# Patient Record
Sex: Male | Born: 1935
Health system: Southern US, Community
[De-identification: ages and names within clinical notes are randomized; demographics above are authoritative.]

## PROBLEM LIST (undated history)

## (undated) DIAGNOSIS — I639 Cerebral infarction, unspecified: Secondary | ICD-10-CM

## (undated) DIAGNOSIS — H269 Unspecified cataract: Secondary | ICD-10-CM

## (undated) DIAGNOSIS — Z801 Family history of malignant neoplasm of trachea, bronchus and lung: Secondary | ICD-10-CM

## (undated) DIAGNOSIS — J449 Chronic obstructive pulmonary disease, unspecified: Secondary | ICD-10-CM

## (undated) DIAGNOSIS — J439 Emphysema, unspecified: Secondary | ICD-10-CM

## (undated) DIAGNOSIS — I6529 Occlusion and stenosis of unspecified carotid artery: Secondary | ICD-10-CM

## (undated) DIAGNOSIS — C4490 Unspecified malignant neoplasm of skin, unspecified: Secondary | ICD-10-CM

## (undated) DIAGNOSIS — M479 Spondylosis, unspecified: Secondary | ICD-10-CM

## (undated) DIAGNOSIS — E785 Hyperlipidemia, unspecified: Secondary | ICD-10-CM

## (undated) DIAGNOSIS — Z8551 Personal history of malignant neoplasm of bladder: Secondary | ICD-10-CM

## (undated) DIAGNOSIS — Z720 Tobacco use: Secondary | ICD-10-CM

## (undated) DIAGNOSIS — I10 Essential (primary) hypertension: Secondary | ICD-10-CM

## (undated) DIAGNOSIS — IMO0002 Reserved for concepts with insufficient information to code with codable children: Secondary | ICD-10-CM

## (undated) DIAGNOSIS — I700 Atherosclerosis of aorta: Secondary | ICD-10-CM

## (undated) DIAGNOSIS — K4090 Unilateral inguinal hernia, without obstruction or gangrene, not specified as recurrent: Secondary | ICD-10-CM

## (undated) DIAGNOSIS — C3430 Malignant neoplasm of lower lobe, unspecified bronchus or lung: Secondary | ICD-10-CM

## (undated) HISTORY — DX: Essential (primary) hypertension: I10

## (undated) HISTORY — DX: Unspecified cataract: H26.9

## (undated) HISTORY — DX: Unilateral inguinal hernia, without obstruction or gangrene, not specified as recurrent: K40.90

## (undated) HISTORY — DX: Tobacco use: Z72.0

## (undated) HISTORY — DX: Unspecified malignant neoplasm of skin, unspecified: C44.90

## (undated) HISTORY — DX: Spondylosis, unspecified: M47.9

## (undated) HISTORY — DX: Family history of malignant neoplasm of trachea, bronchus and lung: Z80.1

## (undated) HISTORY — DX: Emphysema, unspecified: J43.9

## (undated) HISTORY — PX: CATARACT EXTRACTION: SUR2

## (undated) HISTORY — DX: Hyperlipidemia, unspecified: E78.5

## (undated) HISTORY — DX: Cerebral infarction, unspecified: I63.9

## (undated) HISTORY — PX: FLEXIBLE BRONCHOSCOPY W/ LASER: SHX1646

## (undated) HISTORY — DX: Occlusion and stenosis of unspecified carotid artery: I65.29

## (undated) HISTORY — DX: Personal history of malignant neoplasm of bladder: Z85.51

## (undated) HISTORY — PX: EYE SURGERY: SHX253

## (undated) HISTORY — DX: Chronic obstructive pulmonary disease, unspecified: J44.9

## (undated) HISTORY — DX: Reserved for concepts with insufficient information to code with codable children: IMO0002

## (undated) HISTORY — DX: Malignant neoplasm of lower lobe, unspecified bronchus or lung: C34.30

---

## 1997-08-27 ENCOUNTER — Ambulatory Visit (HOSPITAL_BASED_OUTPATIENT_CLINIC_OR_DEPARTMENT_OTHER): Admission: RE | Admit: 1997-08-27 | Discharge: 1997-08-27 | Payer: Self-pay | Admitting: Urology

## 1997-11-14 ENCOUNTER — Ambulatory Visit (HOSPITAL_COMMUNITY): Admission: RE | Admit: 1997-11-14 | Discharge: 1997-11-14 | Payer: Self-pay | Admitting: Pulmonary Disease

## 1997-11-20 ENCOUNTER — Ambulatory Visit: Admission: RE | Admit: 1997-11-20 | Discharge: 1997-11-20 | Payer: Self-pay | Admitting: Pulmonary Disease

## 1997-11-26 ENCOUNTER — Ambulatory Visit: Admission: RE | Admit: 1997-11-26 | Discharge: 1997-11-26 | Payer: Self-pay | Admitting: Pulmonary Disease

## 1997-12-09 ENCOUNTER — Encounter: Payer: Self-pay | Admitting: Thoracic Surgery

## 1997-12-10 ENCOUNTER — Observation Stay: Admission: RE | Admit: 1997-12-10 | Discharge: 1997-12-11 | Payer: Self-pay | Admitting: Thoracic Surgery

## 1997-12-10 ENCOUNTER — Encounter: Payer: Self-pay | Admitting: Thoracic Surgery

## 1997-12-11 ENCOUNTER — Encounter: Payer: Self-pay | Admitting: Thoracic Surgery

## 1997-12-31 ENCOUNTER — Inpatient Hospital Stay: Admission: RE | Admit: 1997-12-31 | Discharge: 1998-01-06 | Payer: Self-pay | Admitting: Thoracic Surgery

## 1997-12-31 ENCOUNTER — Encounter: Payer: Self-pay | Admitting: Thoracic Surgery

## 1997-12-31 HISTORY — PX: OTHER SURGICAL HISTORY: SHX169

## 1998-01-01 ENCOUNTER — Encounter: Payer: Self-pay | Admitting: Thoracic Surgery

## 1998-01-02 ENCOUNTER — Encounter: Payer: Self-pay | Admitting: Thoracic Surgery

## 1998-01-03 ENCOUNTER — Encounter: Payer: Self-pay | Admitting: Thoracic Surgery

## 1998-01-04 ENCOUNTER — Encounter: Payer: Self-pay | Admitting: Thoracic Surgery

## 1998-01-06 ENCOUNTER — Encounter: Payer: Self-pay | Admitting: Thoracic Surgery

## 1998-05-09 ENCOUNTER — Ambulatory Visit (HOSPITAL_COMMUNITY): Admission: RE | Admit: 1998-05-09 | Discharge: 1998-05-09 | Payer: Self-pay | Admitting: Thoracic Surgery

## 1998-05-09 ENCOUNTER — Encounter: Payer: Self-pay | Admitting: Thoracic Surgery

## 1998-05-09 HISTORY — PX: OTHER SURGICAL HISTORY: SHX169

## 1998-05-19 ENCOUNTER — Ambulatory Visit (HOSPITAL_COMMUNITY): Admission: RE | Admit: 1998-05-19 | Discharge: 1998-05-19 | Payer: Self-pay | Admitting: Thoracic Surgery

## 1998-05-19 ENCOUNTER — Encounter: Payer: Self-pay | Admitting: Thoracic Surgery

## 1998-05-22 ENCOUNTER — Encounter: Payer: Self-pay | Admitting: Thoracic Surgery

## 1998-05-22 ENCOUNTER — Ambulatory Visit (HOSPITAL_COMMUNITY): Admission: RE | Admit: 1998-05-22 | Discharge: 1998-05-22 | Payer: Self-pay | Admitting: Thoracic Surgery

## 1998-07-11 ENCOUNTER — Ambulatory Visit (HOSPITAL_BASED_OUTPATIENT_CLINIC_OR_DEPARTMENT_OTHER): Admission: RE | Admit: 1998-07-11 | Discharge: 1998-07-11 | Payer: Self-pay | Admitting: Otolaryngology

## 1998-07-11 HISTORY — PX: TYMPANOPLASTY: SHX33

## 1998-11-02 HISTORY — PX: VIDEO BRONCHOSCOPY: SHX5072

## 1998-11-03 ENCOUNTER — Encounter: Payer: Self-pay | Admitting: Thoracic Surgery

## 1998-11-03 ENCOUNTER — Ambulatory Visit (HOSPITAL_COMMUNITY): Admission: RE | Admit: 1998-11-03 | Discharge: 1998-11-03 | Payer: Self-pay | Admitting: Thoracic Surgery

## 1999-02-18 ENCOUNTER — Encounter: Payer: Self-pay | Admitting: Thoracic Surgery

## 1999-02-18 ENCOUNTER — Encounter: Admission: RE | Admit: 1999-02-18 | Discharge: 1999-02-18 | Payer: Self-pay | Admitting: Thoracic Surgery

## 1999-05-26 ENCOUNTER — Encounter: Admission: RE | Admit: 1999-05-26 | Discharge: 1999-05-26 | Payer: Self-pay | Admitting: Thoracic Surgery

## 1999-05-26 ENCOUNTER — Encounter: Payer: Self-pay | Admitting: Thoracic Surgery

## 1999-06-01 ENCOUNTER — Encounter: Payer: Self-pay | Admitting: Thoracic Surgery

## 1999-06-02 ENCOUNTER — Ambulatory Visit (HOSPITAL_COMMUNITY): Admission: RE | Admit: 1999-06-02 | Discharge: 1999-06-02 | Payer: Self-pay | Admitting: Thoracic Surgery

## 1999-06-02 ENCOUNTER — Encounter (INDEPENDENT_AMBULATORY_CARE_PROVIDER_SITE_OTHER): Payer: Self-pay | Admitting: *Deleted

## 1999-06-02 HISTORY — PX: OTHER SURGICAL HISTORY: SHX169

## 1999-07-06 ENCOUNTER — Ambulatory Visit (HOSPITAL_COMMUNITY): Admission: RE | Admit: 1999-07-06 | Discharge: 1999-07-06 | Payer: Self-pay | Admitting: Thoracic Surgery

## 1999-07-06 ENCOUNTER — Encounter: Payer: Self-pay | Admitting: Thoracic Surgery

## 1999-07-06 ENCOUNTER — Encounter (INDEPENDENT_AMBULATORY_CARE_PROVIDER_SITE_OTHER): Payer: Self-pay | Admitting: *Deleted

## 1999-07-06 HISTORY — PX: MYRINGOTOMY: SUR874

## 1999-08-24 ENCOUNTER — Encounter: Admission: RE | Admit: 1999-08-24 | Discharge: 1999-08-24 | Payer: Self-pay | Admitting: Oncology

## 1999-08-24 ENCOUNTER — Encounter: Payer: Self-pay | Admitting: Oncology

## 1999-09-17 ENCOUNTER — Encounter: Payer: Self-pay | Admitting: Thoracic Surgery

## 1999-09-21 ENCOUNTER — Encounter (INDEPENDENT_AMBULATORY_CARE_PROVIDER_SITE_OTHER): Payer: Self-pay | Admitting: Specialist

## 1999-09-21 ENCOUNTER — Ambulatory Visit (HOSPITAL_COMMUNITY): Admission: RE | Admit: 1999-09-21 | Discharge: 1999-09-21 | Payer: Self-pay | Admitting: Thoracic Surgery

## 1999-10-14 ENCOUNTER — Ambulatory Visit (HOSPITAL_COMMUNITY): Admission: RE | Admit: 1999-10-14 | Discharge: 1999-10-14 | Payer: Self-pay | Admitting: Internal Medicine

## 1999-10-23 ENCOUNTER — Encounter: Payer: Self-pay | Admitting: Thoracic Surgery

## 1999-10-23 ENCOUNTER — Encounter: Admission: RE | Admit: 1999-10-23 | Discharge: 1999-10-23 | Payer: Self-pay | Admitting: Thoracic Surgery

## 1999-10-29 ENCOUNTER — Ambulatory Visit (HOSPITAL_COMMUNITY): Admission: RE | Admit: 1999-10-29 | Discharge: 1999-10-29 | Payer: Self-pay | Admitting: Internal Medicine

## 1999-10-29 ENCOUNTER — Encounter: Payer: Self-pay | Admitting: Internal Medicine

## 1999-12-22 ENCOUNTER — Encounter (INDEPENDENT_AMBULATORY_CARE_PROVIDER_SITE_OTHER): Payer: Self-pay

## 1999-12-22 ENCOUNTER — Ambulatory Visit (HOSPITAL_COMMUNITY): Admission: RE | Admit: 1999-12-22 | Discharge: 1999-12-22 | Payer: Self-pay | Admitting: Urology

## 1999-12-22 HISTORY — PX: OTHER SURGICAL HISTORY: SHX169

## 2000-02-23 ENCOUNTER — Encounter: Payer: Self-pay | Admitting: Thoracic Surgery

## 2000-02-23 ENCOUNTER — Encounter: Admission: RE | Admit: 2000-02-23 | Discharge: 2000-02-23 | Payer: Self-pay | Admitting: Thoracic Surgery

## 2000-04-05 ENCOUNTER — Encounter: Payer: Self-pay | Admitting: Thoracic Surgery

## 2000-04-07 ENCOUNTER — Ambulatory Visit (HOSPITAL_COMMUNITY): Admission: RE | Admit: 2000-04-07 | Discharge: 2000-04-07 | Payer: Self-pay | Admitting: Thoracic Surgery

## 2000-04-07 ENCOUNTER — Encounter (INDEPENDENT_AMBULATORY_CARE_PROVIDER_SITE_OTHER): Payer: Self-pay | Admitting: *Deleted

## 2000-07-20 ENCOUNTER — Encounter: Payer: Self-pay | Admitting: Thoracic Surgery

## 2000-07-20 ENCOUNTER — Encounter: Admission: RE | Admit: 2000-07-20 | Discharge: 2000-07-20 | Payer: Self-pay | Admitting: Thoracic Surgery

## 2000-10-19 ENCOUNTER — Encounter: Admission: RE | Admit: 2000-10-19 | Discharge: 2000-10-19 | Payer: Self-pay | Admitting: Thoracic Surgery

## 2000-10-19 ENCOUNTER — Encounter: Payer: Self-pay | Admitting: Thoracic Surgery

## 2000-11-09 ENCOUNTER — Encounter (INDEPENDENT_AMBULATORY_CARE_PROVIDER_SITE_OTHER): Payer: Self-pay | Admitting: *Deleted

## 2000-11-09 ENCOUNTER — Ambulatory Visit (HOSPITAL_COMMUNITY): Admission: RE | Admit: 2000-11-09 | Discharge: 2000-11-09 | Payer: Self-pay | Admitting: Thoracic Surgery

## 2000-11-09 ENCOUNTER — Encounter: Payer: Self-pay | Admitting: Thoracic Surgery

## 2000-12-28 ENCOUNTER — Encounter: Payer: Self-pay | Admitting: Otolaryngology

## 2000-12-28 ENCOUNTER — Ambulatory Visit (HOSPITAL_COMMUNITY): Admission: RE | Admit: 2000-12-28 | Discharge: 2000-12-28 | Payer: Self-pay | Admitting: Otolaryngology

## 2001-01-04 ENCOUNTER — Encounter: Payer: Self-pay | Admitting: Oncology

## 2001-01-04 ENCOUNTER — Ambulatory Visit: Admission: RE | Admit: 2001-01-04 | Discharge: 2001-01-04 | Payer: Self-pay | Admitting: Oncology

## 2001-05-17 ENCOUNTER — Encounter: Payer: Self-pay | Admitting: Thoracic Surgery

## 2001-05-17 ENCOUNTER — Encounter: Admission: RE | Admit: 2001-05-17 | Discharge: 2001-05-17 | Payer: Self-pay | Admitting: Thoracic Surgery

## 2001-06-28 ENCOUNTER — Ambulatory Visit (HOSPITAL_COMMUNITY): Admission: RE | Admit: 2001-06-28 | Discharge: 2001-06-28 | Payer: Self-pay | Admitting: Oncology

## 2001-06-28 ENCOUNTER — Encounter: Payer: Self-pay | Admitting: Oncology

## 2001-08-23 ENCOUNTER — Encounter (INDEPENDENT_AMBULATORY_CARE_PROVIDER_SITE_OTHER): Payer: Self-pay | Admitting: *Deleted

## 2001-08-23 ENCOUNTER — Inpatient Hospital Stay (HOSPITAL_COMMUNITY): Admission: AD | Admit: 2001-08-23 | Discharge: 2001-08-25 | Payer: Self-pay | Admitting: Otolaryngology

## 2001-08-23 ENCOUNTER — Encounter: Payer: Self-pay | Admitting: Otolaryngology

## 2001-08-23 HISTORY — PX: OTHER SURGICAL HISTORY: SHX169

## 2001-08-24 ENCOUNTER — Encounter: Payer: Self-pay | Admitting: Otolaryngology

## 2001-11-14 ENCOUNTER — Encounter: Admission: RE | Admit: 2001-11-14 | Discharge: 2001-11-14 | Payer: Self-pay | Admitting: Thoracic Surgery

## 2001-11-14 ENCOUNTER — Encounter: Payer: Self-pay | Admitting: Thoracic Surgery

## 2001-12-26 ENCOUNTER — Ambulatory Visit (HOSPITAL_COMMUNITY): Admission: RE | Admit: 2001-12-26 | Discharge: 2001-12-26 | Payer: Self-pay | Admitting: Oncology

## 2001-12-26 ENCOUNTER — Encounter: Payer: Self-pay | Admitting: Oncology

## 2002-05-17 ENCOUNTER — Encounter: Admission: RE | Admit: 2002-05-17 | Discharge: 2002-05-17 | Payer: Self-pay | Admitting: Thoracic Surgery

## 2002-05-17 ENCOUNTER — Encounter: Payer: Self-pay | Admitting: Thoracic Surgery

## 2002-11-13 ENCOUNTER — Encounter: Payer: Self-pay | Admitting: Oncology

## 2002-11-13 ENCOUNTER — Encounter: Admission: RE | Admit: 2002-11-13 | Discharge: 2002-11-13 | Payer: Self-pay | Admitting: Oncology

## 2003-05-14 ENCOUNTER — Encounter: Admission: RE | Admit: 2003-05-14 | Discharge: 2003-05-14 | Payer: Self-pay | Admitting: Thoracic Surgery

## 2003-11-11 ENCOUNTER — Encounter: Admission: RE | Admit: 2003-11-11 | Discharge: 2003-11-11 | Payer: Self-pay | Admitting: Oncology

## 2004-02-13 ENCOUNTER — Ambulatory Visit: Payer: Self-pay | Admitting: Oncology

## 2004-02-14 ENCOUNTER — Encounter: Admission: RE | Admit: 2004-02-14 | Discharge: 2004-02-14 | Payer: Self-pay | Admitting: Oncology

## 2004-05-12 ENCOUNTER — Encounter
Admission: RE | Admit: 2004-05-12 | Discharge: 2004-05-12 | Payer: Self-pay | Admitting: Thoracic Surgery (Cardiothoracic Vascular Surgery)

## 2004-11-10 ENCOUNTER — Encounter: Admission: RE | Admit: 2004-11-10 | Discharge: 2004-11-10 | Payer: Self-pay | Admitting: Thoracic Surgery

## 2005-02-17 ENCOUNTER — Ambulatory Visit: Payer: Self-pay | Admitting: Oncology

## 2005-02-19 ENCOUNTER — Encounter: Admission: RE | Admit: 2005-02-19 | Discharge: 2005-02-19 | Payer: Self-pay | Admitting: Oncology

## 2005-05-12 ENCOUNTER — Encounter: Admission: RE | Admit: 2005-05-12 | Discharge: 2005-05-12 | Payer: Self-pay | Admitting: Thoracic Surgery

## 2005-11-24 ENCOUNTER — Encounter: Admission: RE | Admit: 2005-11-24 | Discharge: 2005-11-24 | Payer: Self-pay | Admitting: Thoracic Surgery

## 2006-02-14 ENCOUNTER — Ambulatory Visit: Payer: Self-pay | Admitting: Oncology

## 2006-02-16 LAB — COMPREHENSIVE METABOLIC PANEL
ALT: 9 U/L (ref 0–53)
Alkaline Phosphatase: 121 U/L — ABNORMAL HIGH (ref 39–117)
CO2: 29 mEq/L (ref 19–32)
Creatinine, Ser: 0.98 mg/dL (ref 0.40–1.50)
Sodium: 142 mEq/L (ref 135–145)
Total Bilirubin: 0.9 mg/dL (ref 0.3–1.2)
Total Protein: 7.1 g/dL (ref 6.0–8.3)

## 2006-02-16 LAB — CBC WITH DIFFERENTIAL/PLATELET
BASO%: 0.3 % (ref 0.0–2.0)
EOS%: 0.7 % (ref 0.0–7.0)
HCT: 50.5 % — ABNORMAL HIGH (ref 38.7–49.9)
LYMPH%: 27.5 % (ref 14.0–48.0)
MCH: 31.6 pg (ref 28.0–33.4)
MCHC: 33.6 g/dL (ref 32.0–35.9)
MCV: 94 fL (ref 81.6–98.0)
MONO#: 0.7 10*3/uL (ref 0.1–0.9)
MONO%: 8.1 % (ref 0.0–13.0)
NEUT%: 63.4 % (ref 40.0–75.0)
Platelets: 185 10*3/uL (ref 145–400)
RBC: 5.38 10*6/uL (ref 4.20–5.71)
WBC: 8.9 10*3/uL (ref 4.0–10.0)

## 2006-02-16 LAB — LACTATE DEHYDROGENASE: LDH: 130 U/L (ref 94–250)

## 2006-02-24 ENCOUNTER — Ambulatory Visit (HOSPITAL_COMMUNITY): Admission: RE | Admit: 2006-02-24 | Discharge: 2006-02-24 | Payer: Self-pay | Admitting: Oncology

## 2006-06-29 ENCOUNTER — Ambulatory Visit: Payer: Self-pay | Admitting: Thoracic Surgery

## 2006-06-29 ENCOUNTER — Encounter: Admission: RE | Admit: 2006-06-29 | Discharge: 2006-06-29 | Payer: Self-pay | Admitting: Thoracic Surgery

## 2006-11-14 ENCOUNTER — Ambulatory Visit: Payer: Self-pay | Admitting: Surgery

## 2006-11-18 ENCOUNTER — Ambulatory Visit: Payer: Self-pay | Admitting: Vascular Surgery

## 2006-11-28 ENCOUNTER — Ambulatory Visit: Payer: Self-pay | Admitting: Vascular Surgery

## 2006-11-28 ENCOUNTER — Encounter: Payer: Self-pay | Admitting: Vascular Surgery

## 2006-11-28 ENCOUNTER — Inpatient Hospital Stay (HOSPITAL_COMMUNITY): Admission: RE | Admit: 2006-11-28 | Discharge: 2006-11-29 | Payer: Self-pay | Admitting: Vascular Surgery

## 2006-12-01 ENCOUNTER — Ambulatory Visit: Payer: Self-pay | Admitting: *Deleted

## 2006-12-16 ENCOUNTER — Ambulatory Visit: Payer: Self-pay | Admitting: Surgery

## 2006-12-28 ENCOUNTER — Encounter: Admission: RE | Admit: 2006-12-28 | Discharge: 2006-12-28 | Payer: Self-pay | Admitting: Thoracic Surgery

## 2006-12-28 ENCOUNTER — Ambulatory Visit: Payer: Self-pay | Admitting: Thoracic Surgery

## 2007-02-13 ENCOUNTER — Ambulatory Visit: Payer: Self-pay | Admitting: Oncology

## 2007-02-15 ENCOUNTER — Ambulatory Visit (HOSPITAL_COMMUNITY): Admission: RE | Admit: 2007-02-15 | Discharge: 2007-02-15 | Payer: Self-pay | Admitting: Oncology

## 2007-02-15 LAB — LACTATE DEHYDROGENASE: LDH: 96 U/L (ref 94–250)

## 2007-02-15 LAB — COMPREHENSIVE METABOLIC PANEL
BUN: 11 mg/dL (ref 6–23)
CO2: 31 mEq/L (ref 19–32)
Calcium: 9.6 mg/dL (ref 8.4–10.5)
Chloride: 105 mEq/L (ref 96–112)
Creatinine, Ser: 0.96 mg/dL (ref 0.40–1.50)
Total Bilirubin: 1.1 mg/dL (ref 0.3–1.2)

## 2007-02-15 LAB — CBC WITH DIFFERENTIAL/PLATELET
Basophils Absolute: 0 10*3/uL (ref 0.0–0.1)
EOS%: 1.7 % (ref 0.0–7.0)
HCT: 47.7 % (ref 38.7–49.9)
HGB: 16.4 g/dL (ref 13.0–17.1)
LYMPH%: 25.6 % (ref 14.0–48.0)
MCH: 32.5 pg (ref 28.0–33.4)
MCHC: 34.3 g/dL (ref 32.0–35.9)
MONO#: 0.7 10*3/uL (ref 0.1–0.9)
NEUT%: 63.2 % (ref 40.0–75.0)
Platelets: 170 10*3/uL (ref 145–400)
lymph#: 2 10*3/uL (ref 0.9–3.3)

## 2007-06-14 ENCOUNTER — Ambulatory Visit: Payer: Self-pay | Admitting: Thoracic Surgery

## 2007-06-14 ENCOUNTER — Encounter: Admission: RE | Admit: 2007-06-14 | Discharge: 2007-06-14 | Payer: Self-pay | Admitting: Thoracic Surgery

## 2007-06-16 ENCOUNTER — Ambulatory Visit: Payer: Self-pay | Admitting: Vascular Surgery

## 2007-12-19 ENCOUNTER — Ambulatory Visit: Payer: Self-pay | Admitting: Thoracic Surgery

## 2007-12-19 ENCOUNTER — Encounter: Admission: RE | Admit: 2007-12-19 | Discharge: 2007-12-19 | Payer: Self-pay | Admitting: Thoracic Surgery

## 2007-12-22 ENCOUNTER — Ambulatory Visit: Payer: Self-pay | Admitting: Vascular Surgery

## 2008-02-14 ENCOUNTER — Ambulatory Visit: Payer: Self-pay | Admitting: Oncology

## 2008-02-16 ENCOUNTER — Ambulatory Visit (HOSPITAL_COMMUNITY): Admission: RE | Admit: 2008-02-16 | Discharge: 2008-02-16 | Payer: Self-pay | Admitting: Oncology

## 2008-02-16 LAB — LACTATE DEHYDROGENASE: LDH: 93 U/L — ABNORMAL LOW (ref 94–250)

## 2008-02-16 LAB — COMPREHENSIVE METABOLIC PANEL
ALT: 11 U/L (ref 0–53)
AST: 13 U/L (ref 0–37)
CO2: 31 mEq/L (ref 19–32)
Creatinine, Ser: 0.93 mg/dL (ref 0.40–1.50)
Sodium: 143 mEq/L (ref 135–145)
Total Bilirubin: 1.1 mg/dL (ref 0.3–1.2)
Total Protein: 6.5 g/dL (ref 6.0–8.3)

## 2008-02-16 LAB — CBC WITH DIFFERENTIAL/PLATELET
BASO%: 0.3 % (ref 0.0–2.0)
Eosinophils Absolute: 0.1 10*3/uL (ref 0.0–0.5)
MCHC: 34 g/dL (ref 32.0–35.9)
MCV: 95.4 fL (ref 81.6–98.0)
MONO#: 0.6 10*3/uL (ref 0.1–0.9)
MONO%: 8.1 % (ref 0.0–13.0)
NEUT#: 4.3 10*3/uL (ref 1.5–6.5)
RBC: 4.97 10*6/uL (ref 4.20–5.71)
RDW: 13.9 % (ref 11.2–14.6)
WBC: 7.1 10*3/uL (ref 4.0–10.0)

## 2008-05-26 IMAGING — CT CT CHEST W/ CM
2 of 3 series · 15 of 36 positions shown, 18 images · IV contrast (omnipaque)
Comparison: 12/28/06.

CLINICAL DATA: Follow-up lung cancer. 
CHEST CT WITH CONTRAST:
TECHNIQUE: Multidetector CT imaging of the chest was performed following the standard protocol during bolus administration of intravenous contrast.
Contrast:  80 cc Omnipaque 300.

[Series 2: chest routine 5.0 b40f · axial · 0.77mm/px · z∈[-432,-137]mm · 12 of 71 slices shown, 15 images]
[im 6/71  mediastinal]
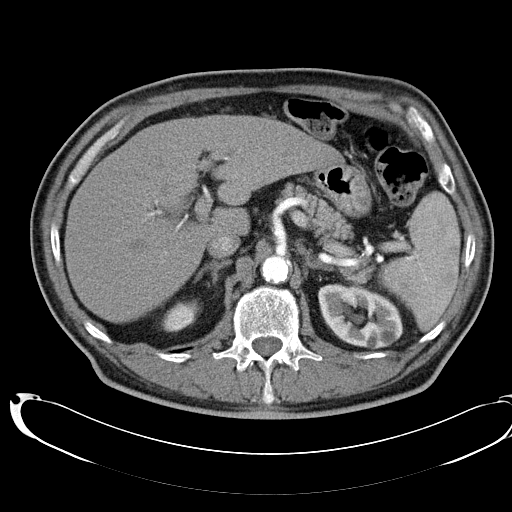
[im 6/71  lung]
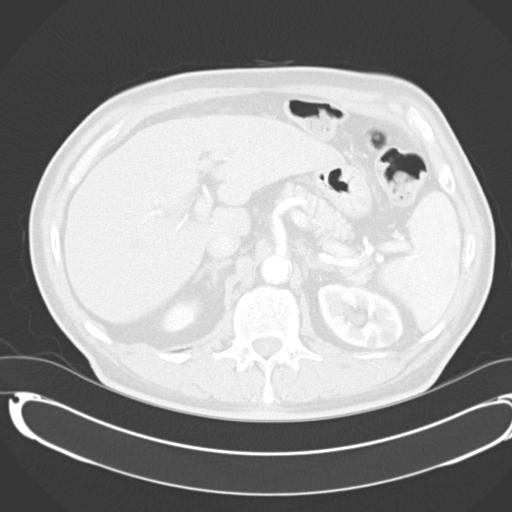
[im 11/71  lung]
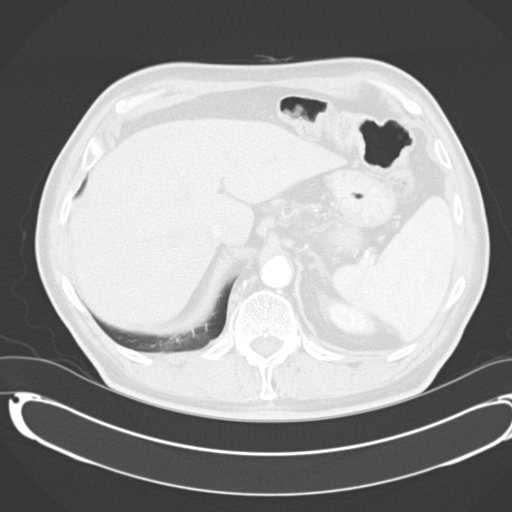
[im 16/71  lung]
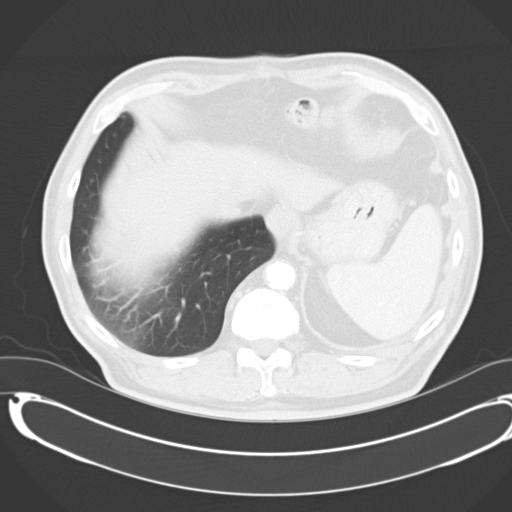
[im 21/71  lung]
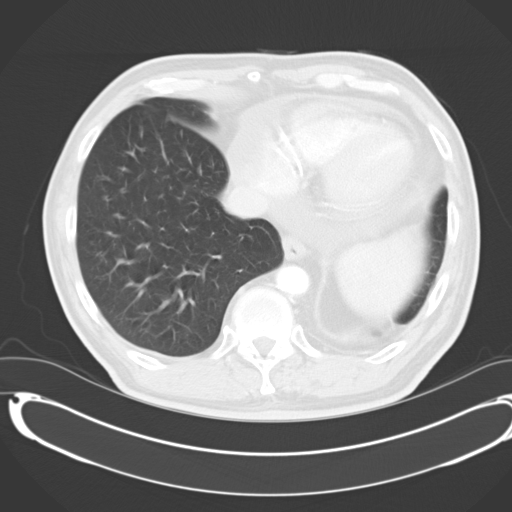
[im 26/71  mediastinal]
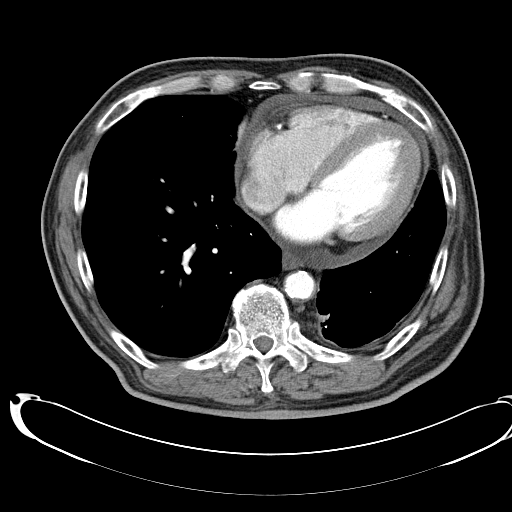
[im 26/71  lung]
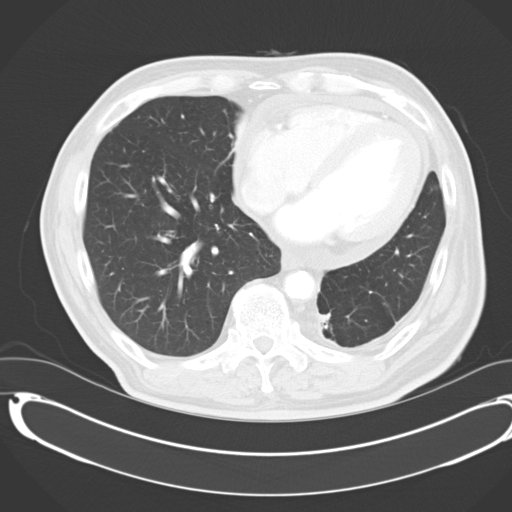
[im 32/71  lung]
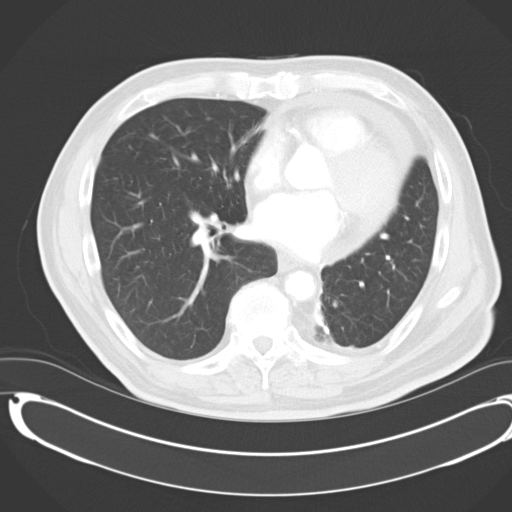
[im 39/71  lung]
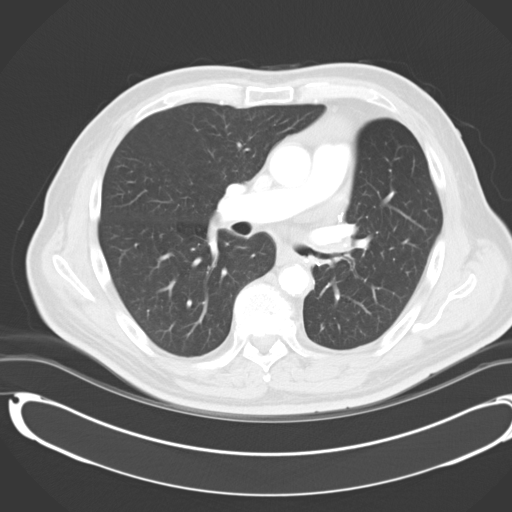
[im 45/71  lung]
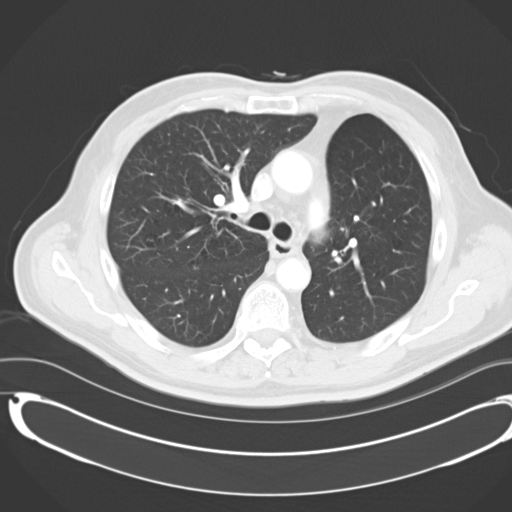
[im 50/71  mediastinal]
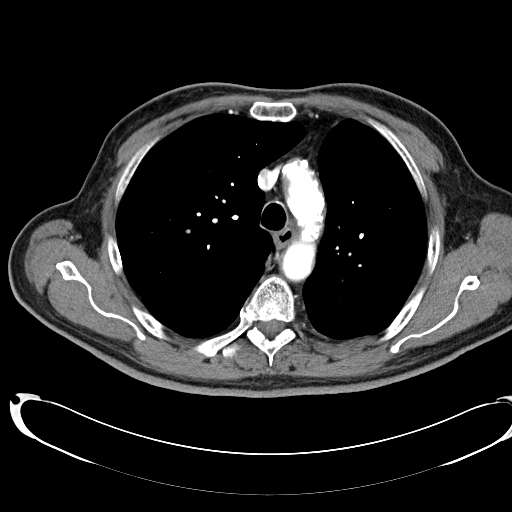
[im 50/71  lung]
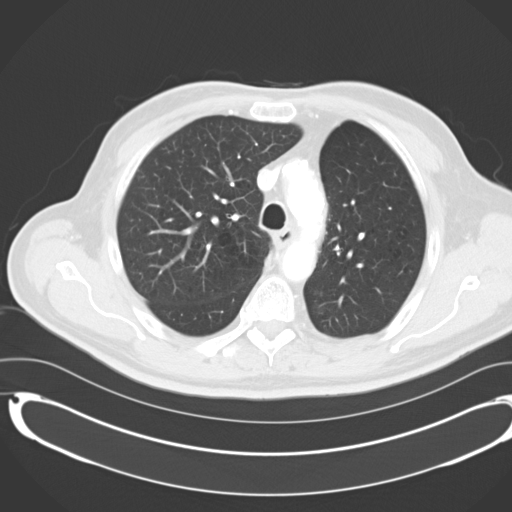
[im 55/71  lung]
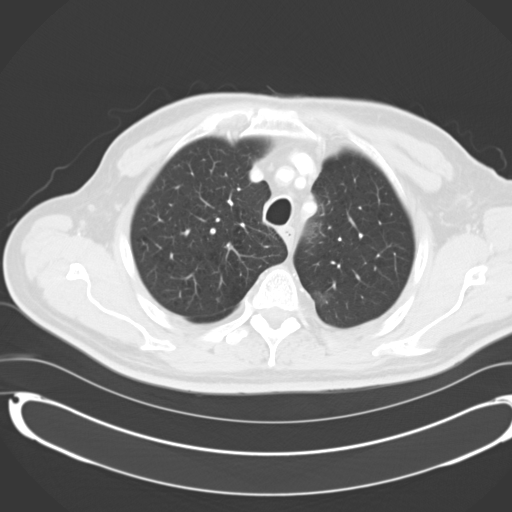
[im 60/71  lung]
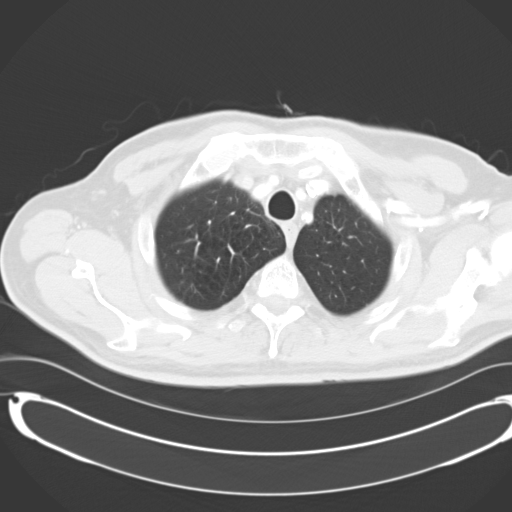
[im 65/71  lung]
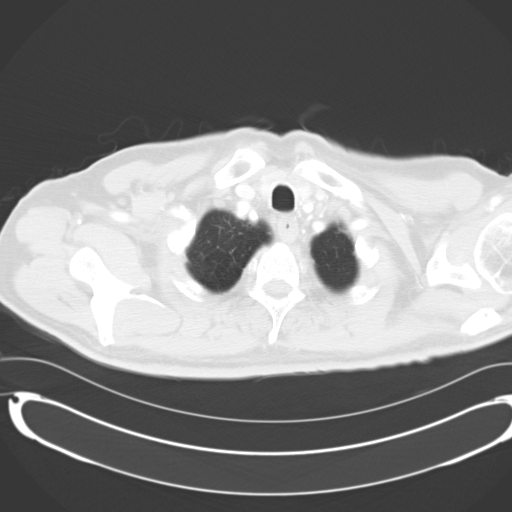

[Series 602: <mpr thick range> · coronal · 0.77mm/px · 3 of 74 slices shown]
[im 15/74  lung]
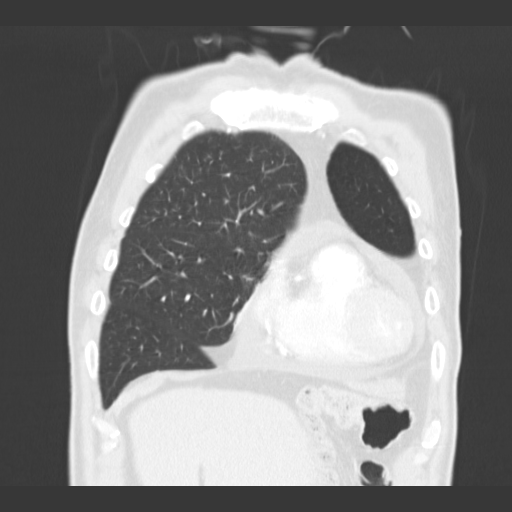
[im 30/74  lung]
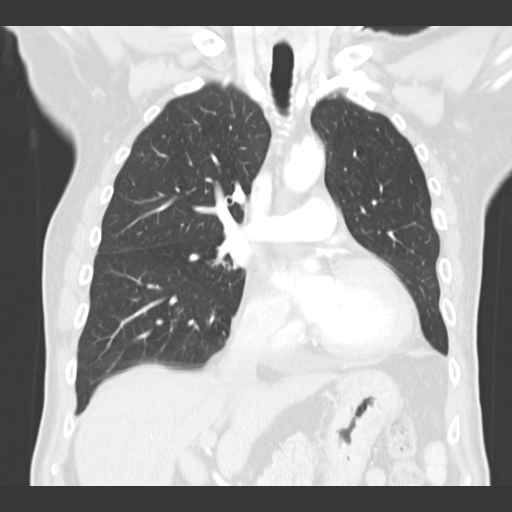
[im 44/74  lung]
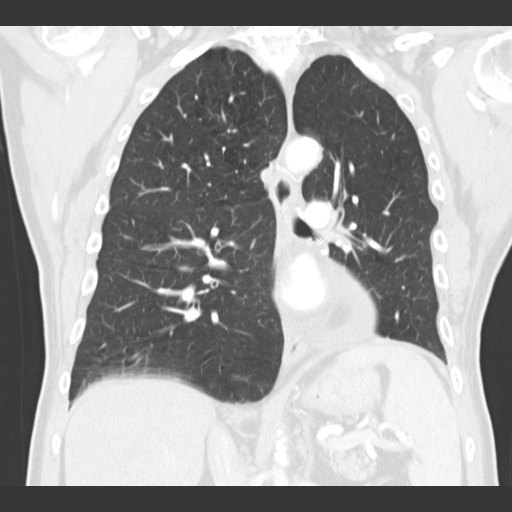

[15 of 36 positions shown; findings below may reference images not displayed]

FINDINGS: No enlarged axillary lymph nodes.   
No enlarged supraclavicular lymph nodes. 
There is no pathologically enlarged mediastinal, or hilar lymph nodes. 
Pericardial effusion is stable when compared with the prior exam.   
There is no pleural fluid. 
The lungs are emphysematous.   
Nonspecific ground-glass density within the left upper lobe, image #19 is stable from prior study.
Tiny subpleural micronodules within the right upper lobe, image 23, are stable from prior exam.   
Subpleural nodule along the inner lobar fissure of the right lung has a ground-glass appearance measuring 4.6 mm, image 35.   This is stable from prior exam.   Review of the bone windows shows mild generalized bone demineralization.  
No lytic or sclerotic lesions are identified.
IMPRESSION: 1.  Stable CT of the chest without specific evidence for residual or recurrent tumor.  
2.  Nonspecific pulmonary nodules in the right upper lobe and subpleural right lung are unchanged. 
3.  Stable pericardial effusion.

## 2008-05-27 ENCOUNTER — Encounter (INDEPENDENT_AMBULATORY_CARE_PROVIDER_SITE_OTHER): Payer: Self-pay | Admitting: General Surgery

## 2008-05-27 ENCOUNTER — Ambulatory Visit (HOSPITAL_COMMUNITY): Admission: RE | Admit: 2008-05-27 | Discharge: 2008-05-28 | Payer: Self-pay | Admitting: General Surgery

## 2008-06-28 ENCOUNTER — Ambulatory Visit: Payer: Self-pay | Admitting: Vascular Surgery

## 2008-12-11 ENCOUNTER — Encounter: Admission: RE | Admit: 2008-12-11 | Discharge: 2008-12-11 | Payer: Self-pay | Admitting: Thoracic Surgery

## 2008-12-11 ENCOUNTER — Ambulatory Visit: Payer: Self-pay | Admitting: Thoracic Surgery

## 2009-02-12 ENCOUNTER — Ambulatory Visit: Payer: Self-pay | Admitting: Oncology

## 2009-02-14 ENCOUNTER — Ambulatory Visit (HOSPITAL_COMMUNITY): Admission: RE | Admit: 2009-02-14 | Discharge: 2009-02-14 | Payer: Self-pay | Admitting: Oncology

## 2009-02-14 LAB — COMPREHENSIVE METABOLIC PANEL
ALT: 11 U/L (ref 0–53)
AST: 15 U/L (ref 0–37)
Alkaline Phosphatase: 95 U/L (ref 39–117)
Chloride: 105 mEq/L (ref 96–112)
Creatinine, Ser: 0.94 mg/dL (ref 0.40–1.50)
Glucose, Bld: 97 mg/dL (ref 70–99)
Potassium: 4.1 mEq/L (ref 3.5–5.3)

## 2009-02-14 LAB — CBC WITH DIFFERENTIAL/PLATELET
BASO%: 0.1 % (ref 0.0–2.0)
Basophils Absolute: 0 10*3/uL (ref 0.0–0.1)
HCT: 49.2 % (ref 38.4–49.9)
HGB: 16.3 g/dL (ref 13.0–17.1)
MCH: 32.1 pg (ref 27.2–33.4)
MCV: 96.9 fL (ref 79.3–98.0)
MONO%: 8 % (ref 0.0–14.0)
Platelets: 160 10*3/uL (ref 140–400)
RBC: 5.08 10*6/uL (ref 4.20–5.82)
RDW: 13.3 % (ref 11.0–14.6)
lymph#: 1.8 10*3/uL (ref 0.9–3.3)

## 2009-02-21 ENCOUNTER — Ambulatory Visit (HOSPITAL_COMMUNITY): Admission: RE | Admit: 2009-02-21 | Discharge: 2009-02-21 | Payer: Self-pay | Admitting: Oncology

## 2009-02-21 ENCOUNTER — Encounter: Payer: Self-pay | Admitting: Internal Medicine

## 2009-03-01 ENCOUNTER — Encounter: Admission: RE | Admit: 2009-03-01 | Discharge: 2009-03-01 | Payer: Self-pay | Admitting: Orthopedic Surgery

## 2009-06-12 ENCOUNTER — Ambulatory Visit: Payer: Self-pay | Admitting: Vascular Surgery

## 2009-12-10 ENCOUNTER — Ambulatory Visit: Payer: Self-pay | Admitting: Thoracic Surgery

## 2009-12-10 ENCOUNTER — Encounter
Admission: RE | Admit: 2009-12-10 | Discharge: 2009-12-10 | Payer: Self-pay | Source: Home / Self Care | Admitting: Thoracic Surgery

## 2010-02-11 ENCOUNTER — Ambulatory Visit: Payer: Self-pay | Admitting: Oncology

## 2010-02-13 ENCOUNTER — Ambulatory Visit (HOSPITAL_COMMUNITY)
Admission: RE | Admit: 2010-02-13 | Discharge: 2010-02-13 | Payer: Self-pay | Source: Home / Self Care | Attending: Oncology | Admitting: Oncology

## 2010-02-13 LAB — COMPREHENSIVE METABOLIC PANEL
ALT: 9 U/L (ref 0–53)
AST: 13 U/L (ref 0–37)
Albumin: 4.5 g/dL (ref 3.5–5.2)
Alkaline Phosphatase: 113 U/L (ref 39–117)
BUN: 11 mg/dL (ref 6–23)
CO2: 29 mEq/L (ref 19–32)
Calcium: 9.5 mg/dL (ref 8.4–10.5)
Chloride: 106 mEq/L (ref 96–112)
Creatinine, Ser: 0.93 mg/dL (ref 0.40–1.50)
Glucose, Bld: 100 mg/dL — ABNORMAL HIGH (ref 70–99)
Potassium: 3.9 mEq/L (ref 3.5–5.3)
Sodium: 143 mEq/L (ref 135–145)
Total Bilirubin: 0.6 mg/dL (ref 0.3–1.2)
Total Protein: 6.9 g/dL (ref 6.0–8.3)

## 2010-02-13 LAB — CBC WITH DIFFERENTIAL/PLATELET
BASO%: 0.8 % (ref 0.0–2.0)
Basophils Absolute: 0.1 10*3/uL (ref 0.0–0.1)
EOS%: 0.7 % (ref 0.0–7.0)
Eosinophils Absolute: 0.1 10*3/uL (ref 0.0–0.5)
HCT: 46.8 % (ref 38.4–49.9)
HGB: 15.9 g/dL (ref 13.0–17.1)
LYMPH%: 26.1 % (ref 14.0–49.0)
MCH: 32.2 pg (ref 27.2–33.4)
MCHC: 33.9 g/dL (ref 32.0–36.0)
MCV: 95.2 fL (ref 79.3–98.0)
MONO#: 0.5 10*3/uL (ref 0.1–0.9)
MONO%: 5.8 % (ref 0.0–14.0)
NEUT#: 5.4 10*3/uL (ref 1.5–6.5)
NEUT%: 66.6 % (ref 39.0–75.0)
Platelets: 175 10*3/uL (ref 140–400)
RBC: 4.92 10*6/uL (ref 4.20–5.82)
RDW: 14 % (ref 11.0–14.6)
WBC: 8.1 10*3/uL (ref 4.0–10.3)
lymph#: 2.1 10*3/uL (ref 0.9–3.3)

## 2010-02-13 LAB — LACTATE DEHYDROGENASE: LDH: 106 U/L (ref 94–250)

## 2010-02-22 ENCOUNTER — Encounter: Payer: Self-pay | Admitting: Thoracic Surgery

## 2010-02-22 ENCOUNTER — Encounter: Payer: Self-pay | Admitting: Oncology

## 2010-05-13 LAB — COMPREHENSIVE METABOLIC PANEL
Alkaline Phosphatase: 125 U/L — ABNORMAL HIGH (ref 39–117)
BUN: 9 mg/dL (ref 6–23)
CO2: 24 mEq/L (ref 19–32)
Chloride: 111 mEq/L (ref 96–112)
GFR calc non Af Amer: >60 mL/min (ref >60)
Glucose, Bld: 106 mg/dL — ABNORMAL HIGH (ref 70–99)
Potassium: 4.7 mEq/L (ref 3.5–5.1)
Total Bilirubin: 0.7 mg/dL (ref 0.3–1.2)

## 2010-05-13 LAB — URINALYSIS, ROUTINE W REFLEX MICROSCOPIC
Nitrite: NEGATIVE
Protein, ur: NEGATIVE mg/dL
Specific Gravity, Urine: 1.008 (ref 1.005–1.030)
Urobilinogen, UA: 1 mg/dL (ref 0.0–1.0)

## 2010-05-13 LAB — CBC
HCT: 47.7 % (ref 39.0–52.0)
Hemoglobin: 16.4 g/dL (ref 13.0–17.0)
MCHC: 34.3 g/dL (ref 30.0–36.0)
Platelets: 167 10*3/uL (ref 150–400)
RDW: 13.7 % (ref 11.5–15.5)

## 2010-05-13 LAB — DIFFERENTIAL
Basophils Absolute: 0 10*3/uL (ref 0.0–0.1)
Basophils Relative: 0 % (ref 0–1)
Neutro Abs: 5 10*3/uL (ref 1.7–7.7)
Neutrophils Relative %: 62 % (ref 43–77)

## 2010-06-11 ENCOUNTER — Other Ambulatory Visit (INDEPENDENT_AMBULATORY_CARE_PROVIDER_SITE_OTHER): Payer: Medicare Other

## 2010-06-11 DIAGNOSIS — I6529 Occlusion and stenosis of unspecified carotid artery: Secondary | ICD-10-CM

## 2010-06-11 DIAGNOSIS — Z48812 Encounter for surgical aftercare following surgery on the circulatory system: Secondary | ICD-10-CM

## 2010-06-16 NOTE — Op Note (Signed)
NAME:  MARGIE, BRINK NO.:  [id number]   MEDICAL RECORD NO.:  [id number]          PATIENT TYPE:  OIB   LOCATION:  5123                         FACILITY:  [facility]   PHYSICIAN:  Angelia Mould. Derrell Lolling, M.D.DATE OF BIRTH:  03-Apr-1935   DATE OF PROCEDURE:  05/27/2008  DATE OF DISCHARGE:                               OPERATIVE REPORT   PREOPERATIVE DIAGNOSES:  1. Incarcerated left inguinal hernia.  2. Right inguinal hernia.   POSTOPERATIVE DIAGNOSES:  1. Incarcerated left inguinal hernia.  2. Right inguinal hernia.   OPERATION PERFORMED:  1. Repair of incarcerated left inguinal hernia with mesh.  2. Repair of right inguinal hernia with mesh.   SURGEON:  Angelia Mould. Derrell Lolling, MD   OPERATIVE INDICATIONS:  This is a 75 year old Caucasian gentleman who  gives a 4-year history of a large left inguinal hernia.  It has been  enlarging.  He has some pain on the left side and a small amount of pain  on the right side.  On exam, he has a large left inguinal hernia that  extends partly into the scrotum that cannot be reduced.  He has a  reducible right inguinal hernia.  He is brought to the operating room  electively.   OPERATIVE FINDINGS:  On the left side, the patient had an incarcerated  left inguinal hernia containing some colonic fat and sigmoid colon.  There was a large indirect sac which once we opened it we had to debride  adhesions, but we were able to ultimately and without much problem take  all the adhesions down and completely reduce the sigmoid colon back into  the peritoneal cavity.  He also had a small direct inguinal hernia on  the left side.  On the right side, he had a small-to-medium size direct  inguinal hernia.  He did not have an indirect hernial on the right side.  We saw the edge of the peritoneum right at the level of the internal  ring.   OPERATIVE TECHNIQUE:  Following the induction of general endotracheal  anesthesia, the patient's lower abdomen and  genitalia were prepped and  draped in a sterile fashion.  Intravenous antibiotics were given.  The  patient was identified as correct patient and correct procedure and  correct site and a surgical time-out was held.  Marcaine 0.5% with  epinephrine was used as local infiltration anesthetic.   I marked symmetrical oblique incisions overlying each groin.  I started  on the left side and made the incision and took the dissection down  through the subcutaneous tissue.  We exposed the aponeurosis of the  external oblique.  The external oblique was incised in the direction of  its fibers opening up the external ring.  We dissected the external  oblique away from underlying structures.  One sensory nerve was  isolated, clamped, divided, and ligated with 2-0 silk tie.  This was  probably the iliohypogastric nerve.  The cord structures were large and  chronically scarred.  I slowly dissected them away and placed a Penrose  drain around them.  We dissected a small lipoma away  and resected that.  We ultimately identified the vas deferens and the testicular vessels and  the hernia sac.  We spent a long time dissecting this out.  We  ultimately felt like we had the sac fairly well dissected and all the  cremasteric muscle fibers away.  We opened the sac and looked inside and  saw the adhesions.  We just slowly dissected all the adhesions away with  sharp dissection and then reduced the sigmoid colon and pericolonic fat  back in the peritoneal cavity.  The hernia sac was closed with a purse-  string suture of 0 silk and the redundant sac was excised.  He also had  a direct hernia on the left side and so I oversewed that with a running  suture of 2-0 Vicryl to reduce that temporarily.  The floor of the  inguinal canal was repaired and reinforced with a 3-inch x 6-inch piece  of Ultrapro polyester mesh.  This was trimmed at the corners to  accommodate the wound.  This was sewed in place with running  sutures and  interrupted mattress sutures of 2-0 Prolene.  The mesh was sutured so as  to generously overlap the fascia at the pubic tubercle, along the  inguinal ligament inferiorly.  Medially and superomedially about 5-6  interrupted mattress sutures of 2-0 Prolene were placed.  Superolaterally, running suture of 2-0 Prolene was used.  The mesh was  incised laterally so as to wrap around the cord structures at the  internal ring.  The tails of the mesh were overlapped laterally and  suture lines completed.  I placed 1 extra suture of 2-0 Prolene just  lateral to the cord structures to tighten up the cord closure.  This  provided a very secure repair both medial and lateral to the cord  structures, but allowed an adequate fingertip opening for the cord  structures.  The wound was irrigated with saline.  The external oblique  was closed with running suture of 2-0 Vicryl placing the cord structures  deep to the external oblique.  Scarpa fascia was closed with 3-0 Vicryl  and skin was closed with running subcuticular suture of 4-0 Monocryl and  Steri-Strips.   We then turned our attention to the right side.  We made a mirror-image  incision taking the dissection down through the subcutaneous tissue,  dividing the external oblique, placing self-retaining retractors in a  similar fashion.  Cord structures were mobilized and encircled with  Penrose drain.  Operative findings were as described above.  We used a 2-  0 Vicryl and oversewed the direct hernia area just simply imbricating  the tissues.  On the right side, we also used a 3 x 6 inch piece of  Ultrapro polyester mesh and basically the technique of suturing was  identical to the opposite side.  After we completed this, it did look  good.  We irrigated this out.  This should be noted that on the right  side I did not neurolysis of both the iliohypogastric and ilioinguinal  nerve tying them off with 2-0 silk ties.   The external  oblique was closed with a running suture of 2-0 Vicryl,  Scarpa fascia was closed with 3-0 Vicryl sutures, and skin was closed  with running suture of 4-0 Monocryl and Steri-Strips.  Clean bandages  were placed and the patient was taken to the recovery room in stable  condition.  Estimated blood loss was about 20 mL.  Complications none.  Sponge,  needle, and instrument counts were correct.      Angelia Mould. Derrell Lolling, M.D.  Electronically Signed     HMI/MEDQ  D:  05/27/2008  T:  05/27/2008  Job:  [id number]   cc:   Thora Lance, M.D.  Genene Churn. Cyndie Chime, M.D.

## 2010-06-16 NOTE — Assessment & Plan Note (Signed)
OFFICE VISIT   Chase Nguyen, Chase Nguyen  DOB:  [birth year]                                        Jun 29, 2006  CHART #:  [id number]   VITAL SIGNS:  Blood pressure 144/81, pulse 78, respirations 18, sats  were 95%.   Chest x-ray was stable.  His lungs were clear to auscultation and  percussion.  There is no evidence of recurrence of his hernia.   He, by history, says that he has developed a left inguinal hernia and  this bothers him at times.  I explained to him about getting this  repaired before it becomes incarcerated and gave him the names of 4  general surgeons to possibly do the surgery.  We have said that we would  be happy to refer him for possible evaluation.  I gave the names of Dr.  Jamey Ripa, Dr. Daphine Deutscher, Dr. Zachery Dakins and Dr. Johna Sheriff.   I will see him back again in 6 months and get a CT scan at that time.   [name], M.D.  Electronically Signed   DPB/MEDQ  D:  06/29/2006  T:  06/29/2006  Job:  [id number]

## 2010-06-16 NOTE — Procedures (Signed)
CAROTID DUPLEX EXAM   INDICATION:  Follow-up carotid artery disease.   HISTORY:  Diabetes:  No.  Cardiac:  Possible murmur.  Hypertension:  Yes.  Smoking:  Yes.  Previous Surgery:  Left CEA 11/28/2006 by Dr. Arbie Cookey.  Known right common  carotid artery and internal carotid artery occlusions.  CV History:  Possible stroke in 11/2006 per patient.  Blind in left eye.  Amaurosis Fugax No, Paresthesias No, Hemiparesis No.                                       RIGHT             LEFT  Brachial systolic pressure:         168               115  Brachial Doppler waveforms:         Triphasic         Biphasic  Vertebral direction of flow:        Antegrade         To and fro  DUPLEX VELOCITIES (cm/sec)  CCA peak systolic                   Occluded          124  ECA peak systolic                   57                136  ICA peak systolic                   Occluded          106  ICA end diastolic                                     27  PLAQUE MORPHOLOGY:                  Heterogeneous     Heterogeneous  PLAQUE AMOUNT:                      CCA and ICA occlusion               Mild  PLAQUE LOCATION:                    ICA/CCA/ECA       CCA   IMPRESSION:  1. Known right CCA and ICA occlusions with flow noted in the ECA via a      superior thyroid artery.  2. Left ICA shows no evidence of restenosis status post CEA.  3. Left abnormal vertebral artery flow combined with brachial pressure      variance, suggestive of pre-left subclavian artery steal.  4. Right vertebral artery flow appears antegrade.   ___________________________________________  Larina Earthly, M.D.   AS/MEDQ  D:  12/22/2007  T:  12/22/2007  Job:  [id number]

## 2010-06-16 NOTE — Procedures (Signed)
CAROTID DUPLEX EXAM   INDICATION:  Followup left carotid artery disease.   HISTORY:  Diabetes:  No.  Cardiac:  Murmur.  Hypertension:  Yes.  Smoking:  Yes.  Previous Surgery:  Left carotid endarterectomy 11/28/2006 by Dr. Arbie Cookey,  known right common carotid artery and ICA occlusion.  CV History:  Questionable CVA in 2008, blind in left eye.  Amaurosis Fugax No, Paresthesias No, Hemiparesis No                                       RIGHT             LEFT  Brachial systolic pressure:         150               140  Brachial Doppler waveforms:         WNL               WNL  Vertebral direction of flow:        Antegrade         Bidirectional  DUPLEX VELOCITIES (cm/sec)  CCA peak systolic                   Occluded          156  ECA peak systolic                                     165  ICA peak systolic                   Occluded          167  ICA end diastolic                   Occluded          33  PLAQUE MORPHOLOGY:                                    Heterogeneous  PLAQUE AMOUNT:                                        Mild  PLAQUE LOCATION:                                      ICA   IMPRESSION:  1. Right common carotid artery and internal carotid artery has a known      occlusion.  2. Left internal carotid artery suggests 40%-59% stenosis however      minimal plaque noted.  3. Left vertebral appears with bidirectional flow.   ___________________________________________  Larina Earthly, M.D.   CB/MEDQ  D:  06/12/2009  T:  06/12/2009  Job:  [id number]

## 2010-06-16 NOTE — Assessment & Plan Note (Signed)
OFFICE VISIT   Chase Nguyen, Chase Nguyen  DOB:  [birth year]                                        December 19, 2007  CHART #:  [id number]   The patient came today.  It is 10 years since we did his surgery.  His  CT scan showed no evidence of recurrence.  He still wants [place] to continue  to follow him, so we will see him again in 1 year with another CT scan.  His blood pressure was 146/73, pulse 97, respirations were 18, and sats  were 97%.  I informed the patient that next time would be the last time  and I will refer him back to his medical doctor.   [name], M.D.  Electronically Signed   DPB/MEDQ  D:  12/19/2007  T:  12/20/2007  Job:  [id number]

## 2010-06-16 NOTE — Assessment & Plan Note (Signed)
OFFICE VISIT   Chase Nguyen, Chase Nguyen  DOB:  July 03, 1935                                        December 11, 2008  CHART #:  [id number]   His blood pressure was 145/72, pulse 68, respirations 18, sats were 97%.  CT scan showed no evidence of recurrence of effusion.  There was slight  increase in his pericardial effusion but otherwise he is stable.  He is  doing well overall.  I plan to see him back again in 1 year with another  CT scan.   [name], M.D.  Electronically Signed   DPB/MEDQ  D:  12/11/2008  T:  12/12/2008  Job:  [id number]   cc:   Thora Lance, M.D.

## 2010-06-16 NOTE — Discharge Summary (Signed)
NAME:  Chase Nguyen, Chase Nguyen NO.:  [id number]   MEDICAL RECORD NO.:  [id number]          PATIENT TYPE:  INP   LOCATION:  3302                         FACILITY:  [facility]   PHYSICIAN:  Larina Earthly, M.D.    DATE OF BIRTH:  [birth year]   DATE OF ADMISSION:  11/28/2006  DATE OF DISCHARGE:  11/29/2006                               DISCHARGE SUMMARY   ADMISSION DIAGNOSIS:  Moderate to severe left internal carotid artery  stenosis with history of  left retinal artery embolus.   DISCHARGE/SECONDARY DIAGNOSES:  1. Moderate to severe left internal carotid artery stenosis with      history of left retinal artery embolus, status post left carotid      endarterectomy.  2. Known right internal carotid artery occlusion.  3. History of lung cancer, status post left lung lobectomy in 1999 by      Dr. Edwyna Shell.  4. Skin cancer of the right ear and on the back.  5. Bilateral cataracts.  6. History of hypertension.  7. Hyperlipidemia.  8. History of bladder cancer, status post surgery.  9. History of ongoing tobacco abuse.  10.Left inguinal hernia.   ALLERGIES:  No known drug allergies.   PROCEDURE:  Left carotid endarterectomy with primary closure on November 28, 2006 by Dr. Kristen Loader. Early.   BRIEF HISTORY:  Chase Nguyen is a 75 year old Caucasian male seen by Dr.  Arbie Cookey in mid October for evaluation of cerebrovascular occlusive  disease.  He had a recent sudden onset of blindness in his left eye and  had a workup including evaluation by ophthalmologist, Dr. Aura Camps.  It was felt to be related to arterial occlusion.  He does have  history of known 60-80% left internal carotid artery stenosis with a  prior duplex in September 2001.  He had no other focal neurologic  deficits.  He underwent a 2-D echocardiogram at [facility] on  November 14, 2006, showing normal ventricular function with no obvious  source of embolus.  He had a carotid duplex at the vascular vein  specialist office on October 15 revealing occlusion of his right  internal carotid artery and velocity suggesting 40-50% stenosis of his  left internal carotid artery.  Based on these findings, Dr. Arbie Cookey felt  it was impossible to determine if his left retinal artery embolus was in  fact from his carotid artery, but since he did not appear to have a  cardiac source he felt it was best to proceed with left carotid  endarterectomy to reduce his future risk for recurrent embolic events.   HOSPITAL COURSE:  Chase Nguyen was electively admitted to [facility] November 28, 2006.  He underwent the previously mentioned  procedure.  He was extubated neurologically intact after short stay in  recovery unit and was transferred to 3300, where he remained until  discharge.  His postoperative course has been uneventful.  His vitals  showed a low grade fever up to 100, which was felt most likely secondary  to atelectasis, blood pressure has been stable 113/47, heart rate in the  60s in sinus rhythm, oxygen saturation 96% on room air.  Labs showed a  white count of 13.2, hemoglobin 12.7, hematocrit 37.5, platelet count  158, sodium 139, potassium low normal at 3.7 which was supplemented, BUN  of 8, creatinine 0.85, and blood glucose 116.  Postop day 1 he was able  to void following removal of his Foley catheter.  Pain was controlled.  He was tolerating p.o.'s and denied dysphagia.   PHYSICAL EXAMINATION:  HEART:  On exam his heart had a regular rate and  rhythm.  LUNGS:  Clear.  ABDOMEN:  Exam was benign.  NEUROLOGICAL:  He was felt to be intact.  He does have a chronic visual  deficit in the left eye.  He said that he is not completely blind but  does have appearance of a veil in the lower portion of his visual field.  His tongue is midline.  He is moving all extremities.  Without his  dentures his left upper lip did appear to have a slight droop but this  appeared corrected once he was  smiling.  Otherwise his face was  symmetrical.   His incision did have a small central area of serous drainage but no  hematoma.  He was instructed to change the dressing daily until the  draining subsided.  On late morning of October 28, it was felt he met  criteria for discharge home.  He was discharged home in stable  condition.  We did discuss smoking cessation prior to his discharge.  At  this point he is not ready to quit but reports he will try to cut down.   DISCHARGE MEDICATIONS:  1. Percocet 5/325 mg 1 to 2 tablets p.o. q.4 h p.r.n. pain.  2. Amlodipine 5 mg p.o. daily.  3. Aspirin 325 mg daily.  4. He was recently started on cholesterol medication but does not      remember the name, but he may resume this as well.   DISCHARGE INSTRUCTIONS:  He can resume a heart-healthy diet.  He may  shower and clean incision gently with soap and water.  Avoid driving or  heavy lifting for the next 2 weeks.  Increase activity slowly.  Call if  he develops redness or drainage from his incision sites, fever greater  than 101, severe headaches or changes in neurological status.  Otherwise  he can see Dr. Arbie Cookey in the __________ office in approximately 2 weeks  and our office will contact him regarding specific appointment date and  time.      Chase Nguyen, P.A.      Larina Earthly, M.D.  Electronically Signed    AWZ/MEDQ  D:  11/29/2006  T:  11/29/2006  Job:  [id number]   cc:   Larina Earthly, M.D.  Thora Lance, M.D.  [name], M.D.  [facility]

## 2010-06-16 NOTE — Assessment & Plan Note (Signed)
OFFICE VISIT   Chase Nguyen, Chase Nguyen  DOB:  Jun 06, 1935                                       06/16/2007  UJWJX#:[id number]   The patient presents today for followup of his left carotid  endarterectomy and primary closure on 11/28/2006.  He has a known  occlusion of his right internal carotid artery.  He is in his usual  health.  He reports no new major medical difficulties and has had no  neurologic deficits.   PHYSICAL EXAM:  Today his blood pressure is 129/69, pulse is 73,  respirations 18.  Radial pulses are 2+ bilaterally.  He is grossly  intact neurologically.  His left endarterectomy incision is well-healed.  He has no bruits bilaterally.  I am pleased with his initial followup  and we will see him on a yearly basis with an ultrasound to rule out any  change.  He will notify us should he develop any neurologic deficits.   Larina Earthly, M.D.  Electronically Signed   TFE/MEDQ  D:  06/16/2007  T:  06/19/2007  Job:  [id number]   cc:   Thora Lance, M.D.

## 2010-06-16 NOTE — Op Note (Signed)
NAME:  HATCHER, FRONING NO.:  [id number]   MEDICAL RECORD NO.:  [id number]          PATIENT TYPE:  INP   LOCATION:  2550                         FACILITY:  [facility]   PHYSICIAN:  Larina Earthly, M.D.    DATE OF BIRTH:  [birth year]   DATE OF PROCEDURE:  11/28/2006  DATE OF DISCHARGE:                               OPERATIVE REPORT   PREOPERATIVE DIAGNOSIS:  Moderate to severe left internal carotid artery  stenosis, with prior left retinal artery embolus.   POSTOPERATIVE DIAGNOSIS:  Moderate to severe left internal carotid  artery stenosis, with prior left retinal artery embolus.   PROCEDURE:  Left carotid endarterectomy and primary closure.   SURGEON:  Larina Earthly, M.D.   ASSISTANT:  Jerold Coombe, P.A.   ANESTHESIA:  General endotracheal.   COMPLICATIONS:  None.   DISPOSITION:  To recovery room neurologically intact.   PROCEDURE IN DETAIL:  The patient was taken to the operating room and  placed supine, where the area of the  left neck was prepped and draped  using a sterile fashion.  Incision was made anterior to  sternocleidomastoid and carried down through the platysma with  electrocautery.  The sternocleidomastoid was reflected posteriorly and  the carotid sheath was opened.  The facial vein was ligated with 2 silk  ties and divided.  The common carotid artery was exposed and was  encircled with umbilical tape and Rumel tourniquet.  The The ansa nerve  was divided, as it was overlying the common carotid artery.  The vagus  and hypoglossal nerves were identified and preserved.  The superior  thyroid artery was encircled with a 2-0 silk Potts tie.  The external  carotid was circled with a blue vessel loop.  The internal carotid  circled with umbilical tape and Rumel tourniquet.  The patient was given  8000 units of intravenous heparin.  After adequate circulation time, the  internal, external and common carotid arteries were occluded.  The  common  carotid artery was opened with a #11 blade and sewn  longitudinally with Potts scissors onto the internal carotid artery.  The patient had a large internal carotid artery.  A #10 shunt was passed  up into the internal carotid, allowed to back bleed and then down the  common carotid -- secured with Rumel tourniquets proximally and  distally.   Endarterectomy was begun on the common carotid artery.  The plaque was  divided proximally with Potts scissors.  The endarterectomy was sent  onto the bifurcation.  The external carotid was endarterectomized with  the eversion technique.  The internal carotid was endarterectomized in  an open fashion.  The remaining atheromatous debris was removed in the  endarterectomy plane.  Due to the large internal carotid artery, the  decision was made to close the arteriotomy primarily.  This was  accomplished with a 6-0 Prolene suture beginning at the proximal and  distal portions of the arteriotomy, and closing towards the middle.  Prior to completion of the arteriotomy closure, the shunt was removed  and the clamps were reoccluded.  The usual flushing  maneuvers were  undertaken.  The anastomosis was completed.  Flow was restored first to  the external and then to the internal carotid arteries.  Excellent flow  characteristics were noted with the handheld Doppler in the internal and  external carotid arteries.  The patient was given 50 mg of protamine to  reverse the heparin.  The wounds were irrigated with saline.  Hemostasis  achieved with electrocautery.  The wounds were closed several 3-0 Vicryl  sutures to reapproximate sternocleidomastoid over the carotid sheath.  Next, the platysma was closed with running 3-0 Vicryl sutures, and  finally skin was closed with a 4-0 subcuticular Vicryl stitch.  Sterile  dressing was applied.  The patient was taken to the recovery room in  stable condition.      Larina Earthly, M.D.  Electronically Signed      TFE/MEDQ  D:  11/28/2006  T:  11/28/2006  Job:  [id number]

## 2010-06-16 NOTE — Assessment & Plan Note (Signed)
OFFICE VISIT   [name], [name]  DOB:  [birth year]                                       12/16/2006  GUYQI#:[id number]   The patient presents today for followup of his left carotid  endarterectomy Dacron patch angioplasty on 10/27.  He had been seen by  Dr. Madilyn Fireman on 10/30 with concerns from the family regarding swelling.  This is thought to be mild with no danger, and he is now seen again by  me for followup.  He is neurologically intact and his left neck incision  is well-healed.  He has a soft left bruit.  No bruit on the right.  I am  quite pleased with his initial recovery and will plan to see him again  in 6 months with carotid duplex.  His family will notify should he  develop any would difficulties or neurologic deficits.   Larina Earthly, M.D.  Electronically Signed   TFE/MEDQ  D:  12/16/2006  T:  12/19/2006  Job:  [id number]   cc:   Thora Lance, M.D.

## 2010-06-16 NOTE — Procedures (Signed)
CAROTID DUPLEX EXAM   INDICATION:  Followup carotid artery disease.   HISTORY:  Diabetes:  No.  Cardiac:  Possible murmur.  Hypertension:  Yes.  Smoking:  Yes.  Previous Surgery:  Left CEA 11/28/2006 by Dr. Arbie Cookey.  Known right CCA  and ICA occlusion.  CV History:  Possible stroke in 2008.  Blind in left eye.  Amaurosis Fugax No, Paresthesias No, Hemiparesis No                                       RIGHT             LEFT  Brachial systolic pressure:         152               125  Brachial Doppler waveforms:         Triphasic         Biphasic  Vertebral direction of flow:        Antegrade         Bidirectional  DUPLEX VELOCITIES (cm/sec)  CCA peak systolic                   Occluded          129  ECA peak systolic                   64                157  ICA peak systolic                   Occluded          106  ICA end diastolic                                     28  PLAQUE MORPHOLOGY:                  Heterogeneous     Heterogeneous  PLAQUE AMOUNT:                      CCA and ICA occlusion               Mild  PLAQUE LOCATION:                    CCA/ICA/ECA       CCA   IMPRESSION:  1. Known right CCA and right ICA occlusions with flow noted in ECA via      the superior thyroid artery.  2. Left ICA shows no evidence of restenosis status post CEA.  3. Left abnormal vertebral artery flow combined with brachial pressure      variance suggestive of pre left subclavian artery steal.  4. Right vertebral artery flow appears antegrade.  5. No significant changes from previous study.   ___________________________________________  Larina Earthly, M.D.   AS/MEDQ  D:  06/28/2008  T:  06/28/2008  Job:  [id number]

## 2010-06-16 NOTE — Assessment & Plan Note (Signed)
OFFICE VISIT   Chase Nguyen, Chase Nguyen  DOB:  19-May-1935                                       12/01/2006  JASNK#:[id number]   The patient is status post left carotid endarterectomy carried out by  Dr. Arbie Cookey 11/28/2006 at [facility].  He and his wife have been  concerned about the amount of neck swelling he has.  He is seen as an  add on today.   His blood pressure is 157/68 in the right arm, 119/65 in the left arm.  Pulse is 54 per minute, respirations 18 per minute.  He is alert and  oriented.  Cranial nerves intact.  Swallowing normally.  Voice is  normal.  He does have a moderate amount of left neck swelling.  This is  not tense.  No bruit audible.  No pulsation present.  No erythema.   The patient is reassured that this does not appear to be a prohibitive  amount of swelling.  We will plan followup with Dr. Arbie Cookey next week to  assure this continues to resolve and improve.   Balinda Quails, M.D.  Electronically Signed   PGH/MEDQ  D:  12/01/2006  T:  12/02/2006  Job:  [id number]

## 2010-06-16 NOTE — Assessment & Plan Note (Signed)
OFFICE VISIT   Chase Nguyen, Chase Nguyen  DOB:  [birth year]                                        December 28, 2006  CHART #:  [id number]   Chase Nguyen came back today, his blood pressure is 117/65, pulse 74,  respiration 18,  95% .  He had recent CVA with an embolus to the left  eye secondary from a carotid and then had a carotid endarterectomy done  by Dr. Arbie Cookey.   Overall, he is stable otherwise.  His CT scan shows no evidence of  recurrence of his cancer.  We will continue to follow him for at least  another year and I would like to see him back again in 6 months with  chest x-rays.  Lungs are clear to auscultation and percussion.   [name], M.D.  Electronically Signed   DPB/MEDQ  D:  12/28/2006  T:  12/28/2006  Job:  [id number]

## 2010-06-16 NOTE — Consult Note (Signed)
NEW PATIENT CONSULTATION   Chase Nguyen, Chase Nguyen  DOB:  02-24-1935                                       11/18/2006  EAVWU#:[id number]   Patient presents today for evaluation of cerebrovascular occlusive  disease.  He had a recent sudden onset of blindness in his left eye, and  he has had workup of this to include evaluation by ophthalmologist, Dr.  Aura Camps.  It was felt to be related to arterial occlusion.  He  does have a history of a known 60-80% left internal carotid stenosis  with a prior duplex in September, 2001.  He has had no other focal  neurological deficits, specifically any amaurosis fugax, ischemic  attack, or stroke prior to this eye event recently.   Past medical history is significant for hypertension, hyperlipidemia.  He does have a prior history of lung cancer with a resection by Dr.  Karle Plumber.  He does have a history of bladder cancer as well.   SOCIAL HISTORY:  He does continue to smoke cigarettes as well.   REVIEW OF SYSTEMS:  Positive only for heart murmur from a cardiac  standpoint.  He does have prior lung cancer, as stated.  Otherwise  negative.   He underwent a 2D echocardiogram, and I have the results of this, at  [facility] on 11/14/06.  This shows normal ventricular function  with no obvious source of embolus.   Carotid duplex in our office on 10/15 revealed occlusion of his right  internal carotid artery.  He had velocities suggesting 40-50% stenosis  in his left internal carotid artery.   PHYSICAL EXAMINATION:  A well-developed and well-nourished white male  appearing his stated age of 25.  He has blood pressure 150/71, pulse 61, respirations 18.  He has  differential blood pressure greater on the right than on the left.  He does not have carotid bruits.  He has equal grip strength  bilaterally.  He has 2+ radial pulses bilaterally.   I have reviewed his carotid duplex with him and his 2D echocardiogram.  I  have explained it is impossible to determine if his left retinal  artery embolus was from his carotid, but he does not appear to have a  cardiac source.  I will discuss this with Dr. Kirby Funk but would  recommend left carotid endarterectomy for reduction of risk for  recurrent embolic events.  I explained the procedure, including a 1-2%  risk of stroke with surgery.  He understands and wishes to proceed at  his earliest convenience.   Larina Earthly, M.D.  Electronically Signed   TFE/MEDQ  D:  11/18/2006  T:  11/21/2006  Job:  [id number]   cc:   Thora Lance, M.D.

## 2010-06-16 NOTE — Letter (Signed)
December 10, 2009   Genene Churn. Cyndie Chime, M.D.  [address] - [facility]  [place], [place] [address]   Re:  Chase Nguyen, Chase Nguyen               DOB:  Mar 13, 1935   Dear Dr. Cyndie Chime:   The patient came today.  His CT scan shows no evidence of recurrence of  his cancer.  His blood pressure is 138/67, pulse 70, respirations 16,  saturations were 97%.  He is doing well overall.  I will see him again  in 1 year and repeat his CT scan.  I think he will be seeing you in  January.   [name], M.D.  Electronically Signed   DPB/MEDQ  D:  12/10/2009  T:  12/11/2009  Job:  [id number]   cc:   Thora Lance, M.D.

## 2010-06-16 NOTE — Assessment & Plan Note (Signed)
OFFICE VISIT   Chase, Nguyen  DOB:  07-27-1935                                        Jun 14, 2007  CHART #:  [id number]   HISTORY:  Patient is seen on routine office visit for surveillance of  well differentiated squamous cell carcinoma status post resection of the  left lower lobe in 1999.  Additionally, he has had ongoing follow-up for  tracheal papillomatosis.  The patient reports that overall his health  has been good.  He continues to smoke.  He has no intentions of  discontinuing that at this point.   A chest x-ray was obtained which shows no new significant findings.  There is chronic left lower lobe postoperative changes which are stable  in appearance.   PHYSICAL EXAMINATION:  VITAL SIGNS:  Blood pressure 114/61, pulse 65,  respirations 18, oxygen saturations 93% on room air.  GENERAL:  This is an elderly-appearing chronically ill-appearing male in  no acute distress.  PULMONARY:  Examination reveals somewhat coarse breath sounds  throughout.  No wheeze.  CARDIAC:  Examination reveals a 2/6 aortic systolic murmur.  EXTREMITIES:  Exam reveals clubbing.  ABDOMEN:  Soft, nontender.  SKIN:  Examination reveals multiple moles and reportedly has recently  undergone a biopsy by a dermatologist for these.  The results are  pending.   ASSESSMENT:  Chase Nguyen is stable at this time in regards to thoracic  surgical issues.  Dr. Edwyna Shell feels as though he would benefit from  ongoing surveillance and this will include a CT scan in 6 months.   Chase Nguyen, P.A.-C.   Sherryll Burger  D:  06/14/2007  T:  06/14/2007  Job:  [id number]

## 2010-06-16 NOTE — Procedures (Signed)
CAROTID DUPLEX EXAM   INDICATION:  Followup, carotid artery disease.   HISTORY:  Diabetes:  No.  Cardiac:  Possible murmur.  Hypertension:  Yes.  Smoking:  Yes.  Previous Surgery:  No.  CV History:  Left eye blindness with left eye arterial event two weeks  ago, possible stroke two weeks ago, per patient.  Amaurosis Fugax ?, Paresthesias No, Hemiparesis No                                       RIGHT             LEFT  Brachial systolic pressure:         163               135  Brachial Doppler waveforms:         Triphasic         Biphasic  Vertebral direction of flow:        Antegrade         To and fro  DUPLEX VELOCITIES (cm/sec)  CCA peak systolic                   Occluded          101  ECA peak systolic                   71                272  ICA peak systolic                   Occluded          177  ICA end diastolic                                     39  PLAQUE MORPHOLOGY:                  Mixed             Calcified  PLAQUE AMOUNT:                      Severe            Moderate  PLAQUE LOCATION:                    ICA/CCA/ECA       ICA/CCA/ECA   IMPRESSION:  1. The right internal carotid artery and common carotid artery show      evidence of total occlusion (not known if new finding).  2. The right external carotid artery does show evidence of flow, mid-      distal.  3. The left internal carotid artery shows evidence of 40-59% stenosis.  4. The left external carotid artery is stenotic.  5. The left vertebral artery flow is to and fro, combined with      reduction in left upper extremity flow, is suggestive of left      subclavian artery stenosis.  6. Right vertebral flow is antegrade.   Dr. Madilyn Fireman was notified and scheduled to see him 11/17/2006.   ___________________________________________  P. Liliane Bade, M.D.   AS/MEDQ  D:  11/14/2006  T:  11/15/2006  Job:  [id number]

## 2010-06-16 NOTE — Procedures (Signed)
CAROTID DUPLEX EXAM   INDICATION:  Followup left carotid endarterectomy, carotid artery  disease.   HISTORY:  Diabetes:  No.  Cardiac:  Possible murmur.  Hypertension:  Yes.  Smoking:  Yes.  Previous Surgery:  Left carotid endarterectomy on 11/28/2006 by Dr.  Arbie Cookey.  CV History:  Possible stroke in October of 2008 per patient.  Amaurosis Fugax No, Paresthesias No, Hemiparesis No                                       RIGHT             LEFT  Brachial systolic pressure:         172               142  Brachial Doppler waveforms:         Triphasic         Biphasic  Vertebral direction of flow:        Antegrade         Bidirectional  DUPLEX VELOCITIES (cm/sec)  CCA peak systolic                   Occluded          164  ECA peak systolic                   61                130  ICA peak systolic                   Occluded          109  ICA end diastolic                                     22  PLAQUE MORPHOLOGY:                  Heterogenous      Heterogenous  PLAQUE AMOUNT:                      Severe            Mild  PLAQUE LOCATION:                    CCA/ICA/ECA       CCA   IMPRESSION:  1. Patent left carotid endarterectomy site with no evidence of      stenosis.  2. Known occlusion of the right CCA and ICA with flow noted in the ECA      via the superior thyroid artery.  3. Significant difference in brachial artery pressures along with      bidirectional left vertebral artery flow is suggestive of a partial      left subclavian steal.  4. No significant change noted from the exam on 11/14/2006.   ___________________________________________  Larina Earthly, M.D.   CH/MEDQ  D:  06/16/2007  T:  06/16/2007  Job:  [id number]

## 2010-06-18 NOTE — Procedures (Unsigned)
CAROTID DUPLEX EXAM  INDICATION:  Followup left carotid artery disease.  HISTORY: Diabetes:  No. Cardiac:  Murmur. Hypertension:  Yes. Smoking:  Yes. Previous Surgery:  Left carotid endarterectomy 11/28/2006. CV History:  The patient is currently asymptomatic. Amaurosis Fugax No, Paresthesias Yes, Hemiparesis No                                      RIGHT             LEFT Brachial systolic pressure: Brachial Doppler waveforms: Vertebral direction of flow:                          Atypical antegrade DUPLEX VELOCITIES (cm/sec) CCA peak systolic                                     114 ECA peak systolic                                     117 ICA peak systolic                                     134 ICA end diastolic                                     16 PLAQUE MORPHOLOGY:                                    Heterogeneous PLAQUE AMOUNT:                                        Mild PLAQUE LOCATION:                                      CCA  IMPRESSION:  Known occluded right common carotid artery and internal carotid artery.  A 40%-59% stenosis within the left internal carotid artery.  This could be secondary to changes in vessel diameter due to post surgical changes.  Atypical vertebral suggestive of partial subclavian steal.         ___________________________________________ Larina Earthly, M.D.  OD/MEDQ  D:  06/11/2010  T:  06/12/2010  Job:  [id number]

## 2010-06-19 NOTE — Op Note (Signed)
[place]. [facility]  Patient:    Chase Nguyen, Chase Nguyen                      MRN: [id number] Proc. Date: 07/06/99 Adm. Date:  [id number] Disc. Date: [id number] Attending:  Cameron Proud CC:         Gloris Manchester. Lazarus Salines, M.D.             D. Karle Plumber, M.D.                           Operative Report  PREOPERATIVE DIAGNOSIS:  Chronic eustachian tube dysfunction, left.  POSTOPERATIVE DIAGNOSIS:  Chronic eustachian tube dysfunction, left.  OPERATION PERFORMED:  Myringotomy with T-tube placement, left.  SURGEON:  Gloris Manchester. Lazarus Salines, M.D.  ANESTHESIA:  General orotracheal per Dr. Edwyna Shell.  BLOOD LOSS:  None.  COMPLICATIONS:  None.  FINDINGS:  Retracted left tympanic membrane with an extremely thick mucoid middle ear effusion and surgical changes.  Adherence of the malleus long handle to the promontory at the umbo.  DESCRIPTION OF PROCEDURE:  With the patient having completed laser bronchoscopy per Dr. Edwyna Shell, the head was rotated towards the right. Microscope and speculum were brought into the field and used to examination and clean the left ear canal.  Findings were as described above.  An anterior inferior radial myringotomy incision was sharply executed.  Right thick middle ear effusion was suctioned free.  Cortisporin drops were insufflated and suctioned free once again.  This loosened the secretions nicely.  A Day hook was placed through the myringotomy and used to free he malleus handle from the promontory.  A modified T-tube was placed without difficulty.  Cortisporin drops were insufflated once again.  At this point the procedure was completed. A cotton ball was placed at the external meatus.  The patient was returned to Dr. Edwyna Shell for additional bronchoscopy.  COMMENTS:  A 75 year old white male with bilateral cholesteatoma and chronic suppurative otitis media now with chronic serous otitis media, left following surgery with ______ for todays  procedure.  Anticipated routine postoperatively recovery with attention to drops and water precautions. DD:  07/06/99 TD:  07/09/99 Job: [id number]

## 2010-06-19 NOTE — Op Note (Signed)
[place]. [facility]  Patient:    RALLY, OUCH Visit Number: [id number] MRN: [id number]          Service Type: MED Location: [id number] Attending Physician:  Fernande Boyden Dictated by:   D. Karle Plumber, M.D. Admit Date:  08/23/2001 Discharge Date: 08/25/2001   CC:         Zola Button T. Lazarus Salines, M.D.   Operative Report  PREOPERATIVE DIAGNOSIS:  Previous left lower lobectomy for squamous cell cancer, papillomatosis of the trachea.  DESCRIPTION OF PROCEDURE:  After general anesthesia the fiberoptic bronchoscope was passed through the endotracheal tube with the endotracheal tube pulled back and the trachea was examined in the area where the previous papillomatosis was that had healed.  No evidence of recurrence.  The carina was in the midline.  The right upper lobe, right middle lobe, and right lower lobe orifices were normal.  The left mainstem, left upper lobe orifices were normal, and the bronchial stump was well healed.  The fiberoptic bronchoscope was removed.  The patient tolerated the procedure well and then Dr. Lazarus Salines was going to do a mastoid procedure. Dictated by:   D. Karle Plumber, M.D. Attending Physician:  Fernande Boyden DD:  08/23/01 TD:  08/26/01 Job: [id number]

## 2010-06-19 NOTE — Consult Note (Signed)
[place]. [facility]  Patient:    Chase Nguyen, Chase Nguyen Visit Number: [id number] MRN: [id number]          Service Type: MED Location: [id number] Attending Physician:  Fernande Boyden Dictated by:   Danae Orleans Venetia Maxon, M.D. Proc. Date: 08/23/01 Admit Date:  08/23/2001 Discharge Date: 08/25/2001                            Consultation Report  REASON FOR CONSULTATION:  CSF leak identified during operative repair of cholesteatoma in the right ear.  HISTORY OF PRESENT ILLNESS:  Chase Nguyen is a 75 year old man with a cholesteatoma in the right ear with progressive hearing loss without drainage or pain.  He has an unremarkable past history for diabetes, CVA, MI, HIV, or hepatitis.  He has a positive history of lung cancer and bladder cancer.  He has undergone prior left lung resection, left mastoidectomy, and removal of bladder tumors.  He is hard of hearing with very diminished hearing in his right ear.  He is admitted today on a same day procedure basis after resection of the right attica/mastoid cholesteatoma.  At the time of surgery tegmen tympany, CSF leak was identified.  I evaluated patient in the operating room and saw the source of the leak from below the tegmen defect.  The patient was elected by Dr. Lazarus Salines to try to repair this defect from below and then to pack the operative site and close the wound.  The patient was then admitted to the neurologic ACU.  He was kept with his head elevated.  He has never had any intracranial imaging and was therefore elected to perform a cranial MRI.  This is scheduled for August 24, 2001.  The patient was examined by me in the neurologic ACU.  He is quite hard of hearing.  He is awake, alert.  HE has a supple neck.  He is hard of hearing in his left ear.  The right side of his head is tightly wrapped with a mastoid wrap.  He has some bloody drainage on the dressing, but without evidence of CSF leak.  His facial  nerve function appears to be intact and symmetric.  Tongue protrudes in the midline.  He has full upper and lower extremity strength in all motor groups, bilaterally symmetric.  Reflexes are symmetric in the upper and lower extremities.  He notes no numbness in his face, arms, or legs.  He has no pronator drift. Reflexes are symmetric in biceps, triceps, brachioradialis, knees, and ankles. Great toes are downgoing to plantar stimulation.  IMPRESSION:  CSF leak, tegmen tympany defect in the right ear.  The patient is admitted to be observed with his head of bed elevated.  He is to have an MRI tomorrow.  He may require neurosurgical obliteration of the tegmen defect from above if he has persistent leak.  Otherwise, he will not require neurosurgical intervention. Dictated by:   Danae Orleans Venetia Maxon, M.D. Attending Physician:  Fernande Boyden DD:  08/23/01 TD:  08/27/01 Job: [id number]

## 2010-06-19 NOTE — Op Note (Signed)
[place]. [facility]  Patient:    Chase Nguyen, Chase Nguyen Visit Number: [id number] MRN: [id number]          Service Type: MED Location: [id number] Attending Physician:  Fernande Boyden Dictated by:   Gloris Manchester. Lazarus Salines, M.D. Proc. Date: 08/23/01 Admit Date:  08/23/2001 Discharge Date: 08/25/2001   CC:         Genene Churn. Cyndie Chime, M.D.  Norton Blizzard, M.D.   Operative Report  PREOPERATIVE DIAGNOSES: 1. Right attic mastoid cholesteatoma with progressive conductive hearing loss. 2. History of lung cancer.  POSTOPERATIVE DIAGNOSES: 1. Right attic mastoid cholesteatoma with progressive conductive hearing loss. 2. History of lung cancer. 3. Tegmen tympany cerebrospinal fluid leak. 4. Basal cell carcinoma, right pinna.  PROCEDURE PERFORMED:  Right canal wall down tympanoplasty/mastoidectomy, myringotomy with T-tube, right tegmen tympany CSF leak repair, flexible bronchoscopy, excision of basal cell carcinoma of right pinna.  SURGEON:  Gloris Manchester. Lazarus Salines, M.D.  THORACIC SURGEON:  D. Karle Plumber, M.D.  ANESTHESIA:  General orotracheal.  ESTIMATED BLOOD LOSS:  25 cc.  COMPLICATIONS:  Surgical exposure of the facial nerve at the second genu.  FINDINGS:  A very dense mastoid bone with a small sclerotic mastoid cavity. Chronic mucosal disease in the mastoid with a large posterior-superior "natural atticotomy."  Destruction of the lenticular process of the incus and a squamous retraction pocket/cholesteatoma filling the entire anterior and posterior attic, causing a dehiscence of the tegmen tympany with a small CSF leak.  Exposure of the horizontal portion of the facial nerve by the diseased process and also partial blue line of the horizontal semicircular canal.  DESCRIPTION OF PROCEDURE:  With the patient in a comfortable supine position, a general orotracheal anesthesia was induced without difficulty.  At an appropriate level, Dr. Edwyna Shell  performed a flexible bronchoscopy with findings according to his operative report.  No additional intervention was necessary.  After Dr. Edwyna Shell was complete, the table was turned 90 degrees away from anesthesia, and the patient was placed in a slight reversed Trendelenburg. The head was turned towards the left for access to the right ear which had been confirmed by the patients prior surgery.  One percent Xylocaine with 1:100,000 epinephrine, 12 cc total was infiltrated into the postauricular area in anticipation of the mastoidectomy incision.  The patient had a nonhealing fissure of the antihelical/helical folds at the 9 oclock position in the right pinna which was also infiltrated with additional 1% Xylocaine with 1:100,000 epinephrine.  A simple excisional biopsy of the fissured area on the right helix, approximately 1 cm in greatest dimension was performed and sent down for frozen section.  This returned as basal cell carcinoma and completely excised. While the procedure was going on in other aspects, the lesion was re-excised, an approximately 2 x 1 cm total ellipse.  This was also sent down for frozen section with a silk tag on the inferior aspect for orientation.  This was returned again as basal cell carcinoma and margin clear.  Hemostasis was spontaneous.  The wound was closed at the completion of the procedure with interrupted 5-0 Ethilon sutures.  Using microscope and speculum, the ear canal was examined and cleaned.  Four quadrant injections were performed in the standard fashion using 1% Xylocaine with 1:100,000 epinephrine, 2 cc total.  Attention was returned to the postauricular area where the standard mastoidectomy incision was sharply executed, and carried down to the periosteum over the mastoid cortex.  The temporalis fascia was cleaned on  its lateral surface, inferior edge was incised, and the medial surface cleaned, and a roughly 2 x 2 cm piece of fascia was  harvested fresh and placed to dry. Hemostasis was spontaneous.  The periosteum was incised along the linea temporalis and vertically along the mastoid cortex, and elevated posteriorly and anteriorly using the Lembert elevator.  Attention was returned to the ear canal where a 6 oclock vascular strip incision was executed and a roughly an 11 oclock incision also executed. These were communicated near the annulus, but lateral to the atticotomy.  The flap was window shaded upward.  The tympanic membrane was elevated.  The annulus at approximately the 8 oclock position in the middle ear space was entered.  A moderately thick mucoid effusion was evacuated.  No further dissection in the middle ear was performed at this time.  Before the drum was more thoroughly distorted, an anterior-inferior radial myringotomy incision was sharply executed.  Middle ear contents were suctioned free.  A modified Richardson T-tube was placed without difficulty.  Attention was returned to the postauricular region where the periosteal elevation was carried forward and down the ear canal using a [employer] operational officer. Self-retaining and Perkins retractors were used to lay the ear forward and retain it in such position.  Using a 7 mm cutting bur, a mastoidectomy was begun along the linea temporalis and along the posterior ear canal.  This was carried down in gradual stages, taking pains to saucerize the cavity at all stages.  The middle fossa plate was identified and not disturbed.  Working more deeply, the sigmoid sinus was identified and the sinodural angle was developed.  There was soft marrow-containing tissue down to the mastoid tip which was again more thoroughly saucerized.  The posterior canal was thin, but left intact at this point.  Working more medially and up into the zygomatic root, finally air cells were identified with thick mucus and heavy mucosa, but no actual  cholesteatoma back in the mastoid.   This was dissected off the middle fossa plate off of the sigmoid sinus and off of the posterior canal wall and soft tissue.  This was felt to be a facial recess cell.  It was uncovered in the region of the depth of the antrum.  Further dissection suggested that perhaps this was the second genu of the facial nerve and it was explored anteriorly and inferiorly, and was in fact part of the facial nerve.  It was significantly lateral in its genus and then more medial in the descending portion of the facial nerve.  The facial nerve in the region where it had been uncovered was further opened in all directions, and the bone chips encasing the facial nerve were carefully removed for a distance of approximately 3 cm. The horizontal portion was not visualized at this point.  Upon inserting the facial nerve in this location, inspection revealed there was a partial blue line of the horizontal semicircular canal from the diseased process, and no further drilling in this region was necessary.  Working further forward along the external canal along the zygomatic root and along the middle fossa plate, the mucosal sac was dissected up towards the attic.  The middle ear was reopened from postauricular and the tympanic membrane laid forward and the dissection of the tympanic membrane away from the mucosa of the promontory was performed.  The tube was identified from the middle ear space.  The Bellucci scissors were used to amputate the annulus at approximately the 9 oclock  position and carried up to the long process of the malleus and retinaculum was used to incise along the long process of the malleus to resect the posterior-superior quadrant of the tympanic membrane were really retracted back into the mastoid and up into the attic.  At this point, with the facial nerve identified, and the tympanic membrane taken forward, the posterior canal wall was taken down in the standard fashion inneratively.  Upon  encountering the mucosal sac, the dissection was halted with the drill and further dissection, tearing from the mucosal sac from the attic and mastoid down into the posterior-superior quadrant of the middle ear and forward into the attic.  The incus was noted to be mobile at the incudostapedial joint and it was disarticulated and removed, and saved for later use.  There was a cholesteatoma sac anterior to the head of the malleus which was carefully dissected.  Upon dissecting the sac out of the attic, a 3 mm dehiscence of the tegmen tympany was identified and a small spinal fluid leak was noted.  This was immediately beneath the head of the malleus.  The malleus head was amputated just above the short process and carefully removed. The mucosa was cleared away from the CSF leak and the dura was gently dissected up into the opening to allow repair.  At this point, the mucosal sac and the squamous sac up in the attic was dissected out the middle ear.  The horizontal portion of the facial nerve was observed to be dehiscent for approximately half of its distance up to the trochlear process.  It was further dissected up into the sinus tympany and the stapes was identified and observed to be intact and mobile.  The mucosa was dissected free.  The dehiscent facial nerve was immediately above the stapes stapes capitulum.  At this point, the middle ear space was aerated and dissected free, and the malleous long handle was mobile but remained intact with the drum.  Dr. Danae Orleans. Venetia Maxon, neurosurgeon, was called in to evaluate the findings, and the plan was to attempt a repair from below of the leak, observe the patient in the hospital as long as required, and then make a later decision regarding the possibility of a lumbar drain versus a repair of the CSF leak from above. We will obtain an MRI scan at his recommendation to assess the status of the ventricles and rule out hydrocephalus.  At this  point, the dissection of the cholesteatoma was completed.  A small bone chip was harvested from the mastoid cortex.  A small portion of the previously temporalis fascia was placed up into the CSF, but it was difficult to tuck it up, and there was some thickness to the bone in this region. Finally, the bone was fashioned in such a fashion that it could be wedged into the opening and then fascia was laid over this.  The leak did seem to be much subdued at this point.  A piece of Gelfoam was placed to support the temporalis fascia and the bone graft.  No further CSF leak was noted at this time.  The facial nerve had been carefully protected throughout the duration of the procedure.  At this point, the remaining fascia was laid into the middle ear space and into the tympanic membrane and across the facial nerve and up into the attic to seal the middle ear space.  The graft was supported after first laying the tympanic membrane back down, and Cortisporin impregnated Gelfoam  plugs. Hemostasis was observed.  A wide meatoplasty was performed in the standard fashion and the cartilage was removed from the posterior flap, and the flap was tucked back into the mastoid cavity using 3-0 chromic sutures.  The ear was laid back down and the wound was reapproximated with buried 3-0 chromic sutures.  The skin was cleaned.  Benzoin was applied and Steri-Strips were applied in the standard fashion.  A large roll of Gelfoam was placed in the external meatus to support the meatoplasty and to provide hemostasis.  A cotton ball was placed in the costal fold.  Note as above, that the basal cell carcinoma incision had been performed previously, but was closed at this point.  A standard fluff and sling mastoid wrap was performed.  At this point, the procedure was completed.  The patient was returned to anesthesia, awakened, extubated, and transferred to recovery in stable condition.  COMMENTS:  A 75 year old  white male with a history of lung cancer and prior history of cholesteatoma, chronic suppurative otitis media, surgery on the left ear, and is having progressive conductive hearing loss on the right side  consistent with a cholesteatoma and is the indication for todays procedure. Although prior CT scan did show some thinning of the tegmen, no exact leak was identified, nor did the patient have any symptoms of this prior.  Facial nerve was opened at the second genu, owing to an abnormal configuration and the horizontal portion was already opened with a diseased process.  The fascial graft was noted to lie directly on top of the stapes capitulum and was not felt to need to require any further ossiculoplasty.  Patching was performed as above for CSF leak with the advice of Dr. Maeola Harman, and will observe the patient in the hospital to make sure that there is no further leaking, and to make a later decision on whether he should require lumbar drainage or repair from above. Dictated by:   Gloris Manchester. Lazarus Salines, M.D. Attending Physician:  Fernande Boyden DD:  08/23/01 TD:  08/26/01 Job: [id number]

## 2010-06-19 NOTE — Procedures (Signed)
[place]. [facility]  Patient:    Chase Nguyen, Chase Nguyen                      MRN: [id number] Adm. Date:  [id number] Disc. Date: [id number] Attending:  Cameron Proud CC:         Barbaraann Share, M.D. [facility]                           Procedure Report  PREOPERATIVE DIAGNOSIS:  Tracheal papillomatosis and severe dysplasia.  POSTOPERATIVE DIAGNOSIS:  Tracheal papillomatosis and severe dysplasia.  PROCEDURE:  Fiberoptic bronchoscopy and laser bronchoscopy.  SURGEON:  D. Karle Plumber, M.D.  ANESTHESIA:  General anesthesia.  DESCRIPTION OF PROCEDURE:  After general anesthesia, the video bronchoscope was passed through the endotracheal tube.  At 12 oclock of the distal trachea, an area of nodularity was seen.  Then the video bronchoscope was passed down the right mainstem bronchus.  The right upper lobe, right middle lobe, and right lower lobe orifices were normal.  The carina was in the midline.  The left mainstem bronchus was normal.  The left lower lobe bronchial stump was well-healed.  There was some slight nodularity there at the bronchial stump, and biopsies were taken at the bronchial stump as well as the distal trachea.  Then the video bronchoscope was removed and the laser bronchoscope was inserted.  The laser fiber was set at 15 watts _____ continuously, and a bullet probe was placed and then using that, the area of nodularity from about 10 oclock to 12 oclock was lasered without difficulty, using 362 joules.  The laser bronchoscope was removed and the laser probe was removed.  The fiberoptic bronchoscope was inserted, and no lesions could be seen, and the video bronchoscope was removed and the patient was returned to the recovery room in stable condition. DD:  09/21/99 TD:  09/22/99 Job: [id number]

## 2010-06-19 NOTE — Op Note (Signed)
[place]. [facility]  Patient:    LINDEL, MARCELL                      MRN: [id number] Proc. Date: 07/06/99 Adm. Date:  [id number] Disc. Date: [id number] Attending:  Cameron Proud CC:         Norton Blizzard, M.D. (2 copies)                           Operative Report  PREOPERATIVE DIAGNOSIS:  Distal trachea dysplasia and papillomatosis.  POSTOPERATIVE DIAGNOSIS:  Distal trachea dysplasia and papillomatosis.  OPERATION PERFORMED:  Laser bronchoscopy.  SURGEON:  D. Karle Plumber, M.D.  ANESTHESIA:  General.  DESCRIPTION OF PROCEDURE:  After adequate general anesthesia, a laser scope was passed down to the distal trachea approximately 2 to 3 cm above the carina.  At 9 oclock on the wall you could areas of papillomatosis as well as dysplasia.  Multiple biopsies were taken of this area and this area was first lased by dropped the oxygen below 30% and using the bullet probe at 16 watt seconds continuously getting the larger portions of the papillomatosis, and then it was switched to ______ 25 watt seconds and was then used to get the area of in situ around the larger areas.  This was done with several laser applications.  After 1520 joules were done the laser scope was removed then Dr. Lazarus Salines did a tube myringotomy.  The patient was returned to the recovery room in stable condition. DD:  07/06/99 TD:  07/09/99 Job: [id number]

## 2010-06-19 NOTE — Op Note (Signed)
[facility]  Patient:    GUILLAUME, WENINGER                      MRN: [id number] Proc. Date: 12/22/99 Adm. Date:  [id number] Attending:  Ellwood Handler CC:         Georgann Housekeeper, M.D.  D. Karle Plumber, M.D.   Operative Report  DATE OF BIRTH:  1935-12-08  REFERRING PHYSICIANS: 1. Georgann Housekeeper, M.D. 2. D. Karle Plumber, M.D.  PREOPERATIVE DIAGNOSIS:  Bladder tumor.  POSTOPERATIVE DIAGNOSIS:  Bladder tumor.  OPERATION:  Cystoscopy, transurethral resection of bladder, small.  SURGEON:  Verl Dicker, M.D.  ANESTHESIA:  LMA.  SPECIMENS:  Bladder tumor.  DRAINS:  20 [place] Foley.  INDICATIONS:  History of papillary noninvasive TCCA.  Initial tumor of September 1991, most recently of February 1997 and July 1979.  All pathology reports grade I noninvasive.  Office cystoscopy November 30, 1999, showed papillary lesion at the dome.  DESCRIPTION OF PROCEDURE:  The patient was prepped and draped in the dorsal lithotomy position after institution of an adequate level of general anesthesia (LMA).  A 21 French panendoscope was gently inserted at the urethral meatus, normal meatus and sphincter, nonobstructive prostate.  The urothelium was carefully inspected with the 30 and 70 degree lenses. Papillary lesion was identified along the dome at about the 11 oclock position.  The remainder of the urothelium appeared unremarkable.  The scope was then withdrawn.  The resectoscope sheath was inserted.  Tumor and adjacent mucosa was then resected between the 12 oclock and 11 oclock position.  No bladder perforations were noted.  Once all tissue had been recovered and sent to pathology, electrode was used to obtain adequate hemostasis in this area. No bladder perforations were noted.  The bladder was then filled to capacity and resectoscope sheath was withdrawn.  A 20 French Foley with a 5 cc balloon was inserted and left to strain drain,  and the patient was returned to recovery in satisfactory condition. DD:  12/22/99 TD:  12/22/99 Job: [id number]

## 2010-06-19 NOTE — Op Note (Signed)
[place]. [facility]  Patient:    Chase Nguyen, Chase Nguyen                      MRN: [id number] Adm. Date:  [id number] Disc. Date: [id number] Attending:  Cameron Proud CC:         Barbaraann Share, M.D. 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  OPERATIONS: 1. Fiberoptic bronchoscopy. 2. Biopsy of bronchial stem. 3. Biopsy of distal tracheal lesion. 4. Laser of distal tracheal lesion.  SURGEON:  D. Karle Plumber, M.D.  ASSISTANT:  ANESTHESIA:  General.  INDICATIONS:  This patient had had a previous left lower lobectomy.  At the time of his left lower lobectomy, he was found to have some distal tracheal lesions.  The biopsy had been questionable for carcinoma in situ versus papilloma.  He required several laser treatments with complete healing.  The last bronchoscopy six months ago was essentially negative.  His chest x-ray has been negative.  A CT scan four or five months was also negative.  He is now being readmitted for bronchoscopy and possible laser bronchoscopy.  He has had no symptoms, but this is for routine surveillance of the lesion.  DESCRIPTION OF PROCEDURE:  After adequate general anesthesia, the fiberoptic bronchoscope was passed through the endotracheal tube.  A lesion was seen at 9 oclock on the left lateral tracheal wall.  It was approximately 3-4 mm in depth and 7-8 mm in width.  Biopsies were taken of this and then the fiberoptic bronchoscope was passed down to the carina, which was in the midline.  The right main stem bronchus was normal.  The right upper lobe, right middle lobe, and right lower lobe bronchus were normal.  On the  left side, the left main stem bronchus was normal.  Then down with the takeoff of the left lower lobe, there was some mild narrowing of the bronchus.  The bronchoscope could be passed easily into the left lower lobe where the orifices then appeared to be normal, but at the bronchial stump there was some granulation tissue and this was biopsied also.  It was decided then to laser the distal tracheal lesion.  The YAG laser was set at 25 watt seconds continuous.  Then using 3 beam, approximately 735 joules were delivered through four or five applications to this lesion.  After this had been done, the patient was awakened, extubated, and returned to the recovery room in stable condition.  Is to checked the pathology and possibly repeat his CT scan.  If carcinoma in situ shows up again, the plan would be to possibly radiate this area. DD:  06/02/99 TD:  06/03/99 Job: [id number]

## 2010-06-19 NOTE — Op Note (Signed)
[place]  Patient:    Chase Nguyen, Chase Nguyen                      MRN: [id number] Proc. Date: 04/07/00 Adm. Date:  [id number] Attending:  Cameron Proud CC:         Norton Blizzard, M.D. (2)   Operative Report  PREOPERATIVE DIAGNOSIS: Papillomatosis of distal trachea.  POSTOPERATIVE DIAGNOSIS: Papillomatosis of distal trachea.  OPERATION/PROCEDURE: Fiberoptic bronchoscope and laser bronchoscopy.  SURGEON: D. Karle Plumber, M.D.  ANESTHESIA: General.  DESCRIPTION OF PROCEDURE: After adequate general anesthesia was obtained the fiberoptic bronchoscope was passed through the endotracheal tube and in the distal trachea approximately 3 cm above the carina at 12 oclock you could see nodules that appeared to be similar to the previous papillomatosis that had been laser treated.  These were biopsied with biopsy forceps.  This went from 10 oclock to 2 oclock on the anterior trachea, a depth of about 5-7 mm. After biopsies had been obtained the YAG laser was set at 22 watt seconds and the entire area laser treated with multiple pulses used to 200 joules until no abnormal tissue could be seen.  The patient tolerated the procedure well and was taken to the recovery room in stable condition. DD:  04/07/00 TD:  04/07/00 Job: [id number]

## 2010-11-11 LAB — PROTIME-INR: Prothrombin Time: 12.8

## 2010-11-11 LAB — COMPREHENSIVE METABOLIC PANEL
ALT: 13
AST: 18
Albumin: 4
Calcium: 9.4
GFR calc Af Amer: >60
Sodium: 142
Total Protein: 6.6

## 2010-11-11 LAB — CBC
MCHC: 33.8
MCHC: 33.9
MCV: 93.9
Platelets: 158
Platelets: 189
RBC: 3.99 — ABNORMAL LOW
RDW: 13.2

## 2010-11-11 LAB — BASIC METABOLIC PANEL
Calcium: 8.2 — ABNORMAL LOW
Chloride: 108
Creatinine, Ser: 0.85
GFR calc Af Amer: >60
Sodium: 139

## 2010-11-11 LAB — ABO/RH: ABO/RH(D): AB NEG

## 2010-11-11 LAB — URINALYSIS, ROUTINE W REFLEX MICROSCOPIC
Glucose, UA: NEGATIVE
Protein, ur: NEGATIVE
Specific Gravity, Urine: 1.007
pH: 6.5

## 2010-11-11 LAB — TYPE AND SCREEN: Antibody Screen: NEGATIVE

## 2010-11-17 ENCOUNTER — Other Ambulatory Visit: Payer: Self-pay | Admitting: Thoracic Surgery

## 2010-11-17 DIAGNOSIS — C3430 Malignant neoplasm of lower lobe, unspecified bronchus or lung: Secondary | ICD-10-CM

## 2010-12-07 ENCOUNTER — Encounter: Payer: Self-pay | Admitting: Thoracic Surgery

## 2010-12-07 DIAGNOSIS — C4490 Unspecified malignant neoplasm of skin, unspecified: Secondary | ICD-10-CM

## 2010-12-07 DIAGNOSIS — H269 Unspecified cataract: Secondary | ICD-10-CM | POA: Insufficient documentation

## 2010-12-07 DIAGNOSIS — K4090 Unilateral inguinal hernia, without obstruction or gangrene, not specified as recurrent: Secondary | ICD-10-CM | POA: Insufficient documentation

## 2010-12-07 DIAGNOSIS — E785 Hyperlipidemia, unspecified: Secondary | ICD-10-CM | POA: Insufficient documentation

## 2010-12-07 DIAGNOSIS — M479 Spondylosis, unspecified: Secondary | ICD-10-CM | POA: Insufficient documentation

## 2010-12-07 DIAGNOSIS — I10 Essential (primary) hypertension: Secondary | ICD-10-CM | POA: Insufficient documentation

## 2010-12-07 DIAGNOSIS — Z801 Family history of malignant neoplasm of trachea, bronchus and lung: Secondary | ICD-10-CM | POA: Insufficient documentation

## 2010-12-07 DIAGNOSIS — J449 Chronic obstructive pulmonary disease, unspecified: Secondary | ICD-10-CM

## 2010-12-15 ENCOUNTER — Ambulatory Visit
Admission: RE | Admit: 2010-12-15 | Discharge: 2010-12-15 | Disposition: A | Discharge status: Home / Self Care | Payer: Medicare Other | Source: Ambulatory Visit | Attending: Thoracic Surgery | Admitting: Thoracic Surgery

## 2010-12-15 ENCOUNTER — Ambulatory Visit (INDEPENDENT_AMBULATORY_CARE_PROVIDER_SITE_OTHER): Payer: Medicare Other | Admitting: Thoracic Surgery

## 2010-12-15 VITALS — BP 126/57 | HR 63 | Resp 16 | Ht 70.0 in | Wt 170.0 lb

## 2010-12-15 DIAGNOSIS — C3430 Malignant neoplasm of lower lobe, unspecified bronchus or lung: Secondary | ICD-10-CM

## 2010-12-15 DIAGNOSIS — IMO0002 Reserved for concepts with insufficient information to code with codable children: Secondary | ICD-10-CM | POA: Insufficient documentation

## 2010-12-15 NOTE — Progress Notes (Signed)
HPI patient returns with a yearly l CT scan. Although there is not and official  report appears to be unchanged. He has had no medical problems within the last year. It is well over 5 years since his resection. We plan to let him be followed by his medical Dr.   Current Outpatient Prescriptions  Medication Sig Dispense Refill  . amLODipine (NORVASC) 10 MG tablet Take 10 mg by mouth daily.        . benazepril (LOTENSIN) 20 MG tablet Take 20 mg by mouth daily.        . pravastatin (PRAVACHOL) 40 MG tablet Take 40 mg by mouth daily.           Review of Systems: Unchanged he still sees Dr. Lazarus Salines for hisear problems   Physical Exam  Cardiovascular: Normal rate, regular rhythm and normal heart sounds.   Pulmonary/Chest: Effort normal. No respiratory distress.     Diagnostic Tests: CT scan of the chest shows no evidence of recurrence of his cancer   Impression: Status post resection left lower lobe for squamous cell cancer status post laser bronchoscopy for carcinoma in size  Plan: Followup by PCP and Dr. Cyndie Chime

## 2011-01-23 ENCOUNTER — Telehealth: Payer: Self-pay | Admitting: Oncology

## 2011-01-23 NOTE — Telephone Encounter (Signed)
Mailed the pt his r/s appt calendar for the feb,march 2013 appts due to dr Diona Browner will be out of the office for surgery. Not able to reach the pt by telephone no vm available.

## 2011-02-19 ENCOUNTER — Other Ambulatory Visit: Payer: Medicare Other | Admitting: Lab

## 2011-04-01 ENCOUNTER — Other Ambulatory Visit (HOSPITAL_BASED_OUTPATIENT_CLINIC_OR_DEPARTMENT_OTHER): Payer: Medicare Other | Admitting: Lab

## 2011-04-01 DIAGNOSIS — Z85828 Personal history of other malignant neoplasm of skin: Secondary | ICD-10-CM

## 2011-04-01 DIAGNOSIS — Z85118 Personal history of other malignant neoplasm of bronchus and lung: Secondary | ICD-10-CM

## 2011-04-01 LAB — CBC WITH DIFFERENTIAL/PLATELET
BASO%: 1.2 % (ref 0.0–2.0)
LYMPH%: 28.7 % (ref 14.0–49.0)
MCHC: 33.3 g/dL (ref 32.0–36.0)
MONO#: 0.6 10*3/uL (ref 0.1–0.9)
Platelets: 159 10*3/uL (ref 140–400)
RBC: 4.4 10*6/uL (ref 4.20–5.82)
WBC: 7.7 10*3/uL (ref 4.0–10.3)

## 2011-04-01 LAB — COMPREHENSIVE METABOLIC PANEL
ALT: 10 U/L (ref 0–53)
Alkaline Phosphatase: 97 U/L (ref 39–117)
CO2: 27 mEq/L (ref 19–32)
Sodium: 143 mEq/L (ref 135–145)
Total Bilirubin: 0.4 mg/dL (ref 0.3–1.2)
Total Protein: 6 g/dL (ref 6.0–8.3)

## 2011-04-05 ENCOUNTER — Ambulatory Visit (HOSPITAL_BASED_OUTPATIENT_CLINIC_OR_DEPARTMENT_OTHER): Payer: Medicare Other | Admitting: Oncology

## 2011-04-05 ENCOUNTER — Ambulatory Visit (HOSPITAL_COMMUNITY)
Admission: RE | Admit: 2011-04-05 | Discharge: 2011-04-05 | Disposition: A | Discharge status: Home / Self Care | Payer: Medicare Other | Source: Ambulatory Visit | Attending: Oncology | Admitting: Oncology

## 2011-04-05 ENCOUNTER — Telehealth: Payer: Self-pay | Admitting: Oncology

## 2011-04-05 ENCOUNTER — Telehealth: Payer: Self-pay

## 2011-04-05 ENCOUNTER — Encounter: Payer: Self-pay | Admitting: Oncology

## 2011-04-05 VITALS — BP 130/60 | HR 61 | Temp 97.8°F | Ht 72.0 in | Wt 175.4 lb

## 2011-04-05 DIAGNOSIS — Z09 Encounter for follow-up examination after completed treatment for conditions other than malignant neoplasm: Secondary | ICD-10-CM | POA: Insufficient documentation

## 2011-04-05 DIAGNOSIS — C3430 Malignant neoplasm of lower lobe, unspecified bronchus or lung: Secondary | ICD-10-CM | POA: Insufficient documentation

## 2011-04-05 DIAGNOSIS — C33 Malignant neoplasm of trachea: Secondary | ICD-10-CM

## 2011-04-05 DIAGNOSIS — Z902 Acquired absence of lung [part of]: Secondary | ICD-10-CM | POA: Insufficient documentation

## 2011-04-05 HISTORY — DX: Malignant neoplasm of lower lobe, unspecified bronchus or lung: C34.30

## 2011-04-05 NOTE — Telephone Encounter (Signed)
appt made and printed for 04/03/12 and pt advised to have cxr prior to lab and to go now for todays cxr  aom

## 2011-04-05 NOTE — Telephone Encounter (Signed)
Pt's spouse notified per Dr Patsy Lager note - CXR negative.  Ms ayad nieman understanding. dph

## 2011-04-05 NOTE — Telephone Encounter (Signed)
Message copied by Albertha Ghee on Mon Apr 05, 2011  3:30 PM ------      Message from: Levert Feinstein      Created: Mon Apr 05, 2011  2:09 PM       Call pt CXR negative  Route cc to Dr Kirby Funk

## 2011-04-05 NOTE — Progress Notes (Signed)
Hematology and Oncology Follow Up Visit  Chase Nguyen [id number] 05/25/1935 76 y.o. 04/05/2011 3:08 PM   Principle Diagnosis: Encounter Diagnoses  Name Primary?  . Lung cancer, lower lobe Yes  . Tracheal cancer   . Cancer of lower lobe of lung      Interim History:    followup visit for this 75 year old man with a history of stage I the squamous cell carcinoma of the left lung status post left lower lobectomy in November 1999. He was found to have high grade dysplasia of the distal trachea around the same time and underwent a laser treatment for that. Despite the fact that he continues to smoke, he remains in remission with no new evidence of lung cancer. He did develop a squamous cell carcinoma on the skin of his back which was resected in January 2001. I did not feel that this was a metastasis rather a primary skin tumor. He recently lost a significant amount of weight down to about 165 pounds from his base weight of 180. He was evaluated by his primary care physician. Since he also had a history of recurrent superficial bladder cancer he was sent to [facility] for a followup cystoscopy which was reported to him as negative. He was put on some boost supplements and has been able to gain back some of his weight. He has a good appetite. He denies any change in bowel habit. No hematochezia or melena. He has never had a colonoscopy and I suggested that this might be a good idea. Wife reports that he is becoming increasingly forgetful.  Medications: reviewed  Allergies: No Known Allergies  Review of Systems: Constitutional: See above   Respiratory: No change in his chronic cough he denies any hemoptysis no dyspnea Cardiovascular:  No chest pain or palpitations Gastrointestinal: See above Genito-Urinary: See above Musculoskeletal: No bone pain Neurologic: No headache or change in vision Skin: No rash or ecchymosis Remaining ROS negative.  Physical Exam: Blood pressure 130/60,  pulse 61, temperature 97.8 F (36.6 C), temperature source Oral, height 6' (1.829 m), weight 175 lb 6.4 oz (79.561 kg). Wt Readings from Last 3 Encounters:  04/05/11 175 lb 6.4 oz (79.561 kg)  12/15/10 170 lb (77.111 kg)     General appearance: Thin but adequately nourished Caucasian man HENNT: Pharynx no erythema or exudate no oropharyngeal mass Lymph nodes: No cervical supraclavicular or axillary adenopathy Breasts: Lungs: Clear to auscultation resonant to percussion Heart: Regular rhythm no murmur Abdomen: Soft nontender no mass no organomegaly Extremities: No edema no calf tenderness Vascular: No cyanosis Neurologic: Motor strength 5 over 5 reflexes 2+ symmetric Skin: No rash or ecchymosis  Lab Results: Lab Results  Component Value Date   WBC 7.7 04/01/2011   HGB 14.1 04/01/2011   HCT 42.2 04/01/2011   MCV 96.0 04/01/2011   PLT 159 04/01/2011     Chemistry      Component Value Date/Time   NA 143 04/01/2011 1251   K 4.2 04/01/2011 1251   CL 108 04/01/2011 1251   CO2 27 04/01/2011 1251   BUN 11 04/01/2011 1251   CREATININE 0.95 04/01/2011 1251      Component Value Date/Time   CALCIUM 9.0 04/01/2011 1251   ALKPHOS 97 04/01/2011 1251   AST 13 04/01/2011 1251   ALT 10 04/01/2011 1251   BILITOT 0.4 04/01/2011 1251       Radiological Studies: Chest x-ray today shows postsurgical changes, emphysema, no evidence for recurrent cancer.   Impression and Plan: #  1. Stage IB T2 N0 squamous carcinoma left lung treated with surgery alone. He remains free of any obvious recurrence now out 13-1/2 years from diagnosis. Plan continue annual followup exam and chest x-ray  #2 high-grade dysplasia distal trachea treated with laser  surgery no new symptoms  #3. Superficial cancer of the bladder no evidence for recurrence per his history (recent cystoscopy)  #4. Status post resection of a cholesteatoma of the left ear.  #5. Status post excision squamous cell carcinoma skin of back  #6.  Obstructive airway disease.  #7. Unexplained weight loss without any anemia or anorexia.  #8. Cerebrovascular disease with history of amaurosis fugax. Status post left carotid endarterectomy 11/28/2006.  #9. Advanced degenerative arthritis of spine and hips   CC:. Dr. Kirby Funk; [facility]   Levert Feinstein, MD 3/4/20133:08 PM

## 2011-06-22 ENCOUNTER — Other Ambulatory Visit: Payer: Medicare Other

## 2011-06-22 ENCOUNTER — Ambulatory Visit: Payer: Medicare Other | Admitting: Vascular Surgery

## 2011-06-25 ENCOUNTER — Encounter: Payer: Self-pay | Admitting: Neurosurgery

## 2011-06-29 ENCOUNTER — Ambulatory Visit (INDEPENDENT_AMBULATORY_CARE_PROVIDER_SITE_OTHER): Payer: Medicare Other | Admitting: Neurosurgery

## 2011-06-29 ENCOUNTER — Encounter: Payer: Self-pay | Admitting: Neurosurgery

## 2011-06-29 ENCOUNTER — Ambulatory Visit (INDEPENDENT_AMBULATORY_CARE_PROVIDER_SITE_OTHER): Payer: Medicare Other | Admitting: *Deleted

## 2011-06-29 VITALS — BP 121/59 | HR 52 | Resp 14 | Ht 72.0 in | Wt 172.5 lb

## 2011-06-29 DIAGNOSIS — Z48812 Encounter for surgical aftercare following surgery on the circulatory system: Secondary | ICD-10-CM

## 2011-06-29 DIAGNOSIS — I6529 Occlusion and stenosis of unspecified carotid artery: Secondary | ICD-10-CM | POA: Insufficient documentation

## 2011-06-29 NOTE — Progress Notes (Signed)
[facility] [place] HISTORY AND PHYSICAL   CC: Annual carotid duplex status post left CEA in 2008 with Dr. Arbie Cookey Referring Physician: Early  History of Present Illness: 76 year old male patient of Dr. Arbie Cookey who is status post left CEA in October 2008. Since that time she's been asymptomatic, he shows no signs or symptoms of CVA, TIA, amaurosis fugax or neural deficit. Patient reports no new medical diagnoses or no recent surgeries.  Past Medical History  Diagnosis Date  . Left inguinal hernia   . Tobacco abuse   . Hx of bladder cancer   . Hyperlipemia   . HTN (hypertension)   . Bilateral cataracts   . Skin cancer     RIGHT EAR AND BACK  . Family hx of lung cancer   . Internal carotid artery occlusion   . Obstructive airway disease   . Degenerative arthritis of spine     AND HIPS  . 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  . Emphysema of lung   . Cancer of lower lobe of lung 04/05/2011    ROS: [x]  Positive   [ ]  Denies    General: [ ]  Weight loss, [ ]  Fever, [ ]  chills Neurologic: [ ]  Dizziness, [ ]  Blackouts, [ ]  Seizure [ ]  Stroke, [ ]  "Mini stroke", [ ]  Slurred speech, [ ]  Temporary blindness; [ ]  weakness in arms or legs, [ ]  Hoarseness Cardiac: [ ]  Chest pain/pressure, [ ]  Shortness of breath at rest [ ]  Shortness of breath with exertion, [ ]  Atrial fibrillation or irregular heartbeat Vascular: [ ]  Pain in legs with walking, [ ]  Pain in legs at rest, [ ]  Pain in legs at night,  [ ]  Non-healing ulcer, [ ]  Blood clot in vein/DVT,   Pulmonary: [ ]  Home oxygen, [ ]  Productive cough, [ ]  Coughing up blood, [ ]  Asthma,  [ ]  Wheezing Musculoskeletal:  [ ]  Arthritis, [ ]  Low back pain, [ ]  Joint pain Hematologic: [ ]  Easy Bruising, [ ]  Anemia; [ ]  Hepatitis Gastrointestinal: [ ]  Blood in stool, [ ]  Gastroesophageal Reflux/heartburn, [ ]  Trouble swallowing Urinary: [ ]  chronic Kidney  disease, [ ]  on HD - [ ]  MWF or [ ]  TTHS, [ ]  Burning with urination, [ ]  Difficulty urinating Skin: [ ]  Rashes, [ ]  Wounds Psychological: [ ]  Anxiety, [ ]  Depression   Social History History  Substance Use Topics  . Smoking status: Former Smoker    Quit date: 12/31/1997  . Smokeless tobacco: Not on file  . Alcohol Use: No    Family History History reviewed. No pertinent family history.  No Known Allergies  Current Outpatient Prescriptions  Medication Sig Dispense Refill  . amLODipine (NORVASC) 10 MG tablet Take 10 mg by mouth daily.        . benazepril (LOTENSIN) 20 MG tablet Take 20 mg by mouth daily.        . pravastatin (PRAVACHOL) 40 MG tablet Take 40 mg by mouth daily.          Physical Examination  Filed Vitals:   06/29/11 1124  BP: 121/59  Pulse: 52  Resp: 14    Body mass index is 23.40 kg/(m^2).  General:  WDWN in NAD Gait: Normal HEENT: WNL Eyes: Pupils equal Pulmonary: normal non-labored breathing , without Rales, rhonchi,  wheezing Cardiac: RRR, without  Murmurs, rubs or gallops; Abdomen: soft, NT, no masses  Skin: no rashes, ulcers noted  Vascular Exam Pulses: 2+ radial pulses bilaterally Carotid bruits: Right carotid is occluded, left carotid has a strong pulse to auscultation Extremities without ischemic changes, no Gangrene , no cellulitis; no open wounds;  Musculoskeletal: no muscle wasting or atrophy   Neurologic: A&O X 3; Appropriate Affect ; SENSATION: normal; MOTOR FUNCTION:  moving all extremities equally. Speech is fluent/normal  Non-Invasive Vascular Imaging CAROTID DUPLEX 06/29/2011  Right ICA 0ccluded stenosis Left ICA 0 - 19% stenosis   ASSESSMENT/PLAN: Asymptomatic patient 5 years status post left CEA, the patient knows signs and symptoms of CVA and noticed report to the nearest emergency room should that occur. His followup will be here in my clinic in one year with repeat carotid duplex, their questions were encouraged and  answered.  Lauree Chandler ANP   Clinic MD: Early

## 2011-06-30 NOTE — Progress Notes (Signed)
Addended by: Sharee Pimple on: 06/30/2011 09:26 AM   Modules accepted: Orders

## 2011-07-06 NOTE — Procedures (Unsigned)
CAROTID DUPLEX EXAM  INDICATION:  Followup left carotid endarterectomy  HISTORY: Diabetes:  No Cardiac:  Murmur Hypertension:  Yes Smoking:  Yes Previous Surgery:  Left CEA 11/28/2006 CV History:  Currently asymptomatic Amaurosis Fugax No, Paresthesias No, Hemiparesis No                                      RIGHT             LEFT Brachial systolic pressure: Brachial Doppler waveforms: Vertebral direction of flow:                          Not visualized DUPLEX VELOCITIES (cm/sec) CCA peak systolic                                     128 ECA peak systolic                                     139 ICA peak systolic                                     109 ICA end diastolic                                     26 PLAQUE MORPHOLOGY:                                    Heterogeneous PLAQUE AMOUNT:                                        Minimal PLAQUE LOCATION:                                      CCA  IMPRESSION: 1. Known right internal carotid artery occlusion and common carotid     artery occlusion. 2. Patent left carotid endarterectomy site with no evidence of     restenosis of the internal carotid artery. 3. The left vertebral artery could not be visualized.  ___________________________________________ Larina Earthly, M.D.  EM/MEDQ  D:  06/29/2011  T:  06/29/2011  Job:  [id number]

## 2012-04-03 ENCOUNTER — Other Ambulatory Visit (HOSPITAL_BASED_OUTPATIENT_CLINIC_OR_DEPARTMENT_OTHER): Payer: Medicare Other | Admitting: Lab

## 2012-04-03 ENCOUNTER — Ambulatory Visit (HOSPITAL_BASED_OUTPATIENT_CLINIC_OR_DEPARTMENT_OTHER): Payer: Medicare Other | Admitting: Oncology

## 2012-04-03 ENCOUNTER — Telehealth: Payer: Self-pay | Admitting: Oncology

## 2012-04-03 ENCOUNTER — Ambulatory Visit (HOSPITAL_COMMUNITY)
Admission: RE | Admit: 2012-04-03 | Discharge: 2012-04-03 | Disposition: A | Discharge status: Home / Self Care | Payer: Medicare Other | Source: Ambulatory Visit | Attending: Oncology | Admitting: Oncology

## 2012-04-03 DIAGNOSIS — C3430 Malignant neoplasm of lower lobe, unspecified bronchus or lung: Secondary | ICD-10-CM | POA: Insufficient documentation

## 2012-04-03 DIAGNOSIS — Z85118 Personal history of other malignant neoplasm of bronchus and lung: Secondary | ICD-10-CM

## 2012-04-03 DIAGNOSIS — C33 Malignant neoplasm of trachea: Secondary | ICD-10-CM

## 2012-04-03 DIAGNOSIS — C4490 Unspecified malignant neoplasm of skin, unspecified: Secondary | ICD-10-CM | POA: Insufficient documentation

## 2012-04-03 LAB — CBC WITH DIFFERENTIAL/PLATELET
BASO%: 0.4 % (ref 0.0–2.0)
EOS%: 0.9 % (ref 0.0–7.0)
HCT: 46.9 % (ref 38.4–49.9)
LYMPH%: 22.8 % (ref 14.0–49.0)
MCH: 32.6 pg (ref 27.2–33.4)
MCHC: 33.7 g/dL (ref 32.0–36.0)
MCV: 96.7 fL (ref 79.3–98.0)
NEUT%: 66.7 % (ref 39.0–75.0)
Platelets: 165 10*3/uL (ref <2.0)

## 2012-04-03 LAB — COMPREHENSIVE METABOLIC PANEL (CC13)
ALT: 14 U/L (ref 0–55)
AST: 13 U/L (ref 5–34)
Creatinine: 0.9 mg/dL (ref 0.7–1.3)
Total Bilirubin: 0.43 mg/dL (ref 0.20–1.20)

## 2012-04-03 NOTE — Telephone Encounter (Signed)
Gave pt Appt for April 2014 lab and MD , reminded pt of chest x-ray before MD visit on 2015, sent pt to radiology today

## 2012-04-03 NOTE — Patient Instructions (Signed)
Chest X ray today See you in one year then you graduate!

## 2012-04-03 NOTE — Progress Notes (Signed)
Hematology and Oncology Follow Up Visit  Chase COURINGTON [id number] October 21, 1935 77 y.o. 04/03/2012 3:39 PM   Principle Diagnosis: Encounter Diagnoses  Name Primary?  . Cancer of lower lobe of lung, left Yes  . Tracheal cancer   . Skin cancer      Interim History:   Mr. Chase Nguyen will be 77 years old next month. He has been followed for multiple primary cancers including lung, trachea, cholesteatoma, squamous cell carcinoma of the skin, superficial bladder cancer. He continues to smoke. He has a mild, chronic smoker's cough. He denies any hemoptysis. I was concerned with unexplained weight loss as low as 165 pounds back in 2012 but he is steadily gained weight back to current weight 175.5 pounds. He had a recent urology followup. Cystoscopy was not done at the time of this visible but will be done at a subsequent visit. He is now seeing a Buyer, retail  in [place]. He is due to have a skin cancer removed from his left hand. This appears to either be a basal cell or squamous cell carcinoma.   Medications: reviewed  Allergies: No Known Allergies  Review of Systems: Constitutional:   Appetite is good. Weight coming up. Respiratory: See above Cardiovascular:  No chest pain or palpitations Gastrointestinal: No change in bowel habit Genito-Urinary: No urinary tract symptoms Musculoskeletal: No muscle or bone pain Neurologic: No headache or change in vision Skin: See above Remaining ROS negative.  Physical Exam: Blood pressure 142/87, pulse 65, temperature 98.3 F (36.8 C), temperature source Oral, resp. rate 20, height 6' (1.829 m), weight 175 lb 8 oz (79.606 kg). Wt Readings from Last 3 Encounters:  04/03/12 175 lb 8 oz (79.606 kg)  06/29/11 172 lb 8 oz (78.245 kg)  04/05/11 175 lb 6.4 oz (79.561 kg)     General appearance: Thin Caucasian man, NAD HENNT: Pharynx no erythema or exudate. Bilateral hearing aids Lymph nodes: No lymphadenopathy Breasts: Lungs: Diffuse  decreased breath sounds. Resonant to percussion Heart: Regular rhythm no murmur Abdomen: Soft, nontender, no mass, no organomegaly Extremities: No edema, no calf tenderness Vascular: No cyanosis Neurologic: Bilateral hearing aids. Motor strength 5 over 5. Skin: Small lesion dorsum left hand  Lab Results: Lab Results  Component Value Date   WBC 8.6 04/03/2012   HGB 15.8 04/03/2012   HCT 46.9 04/03/2012   MCV 96.7 04/03/2012   PLT 165 04/03/2012     Chemistry      Component Value Date/Time   NA 145 04/03/2012 1004   NA 143 04/01/2011 1251   K 3.9 04/03/2012 1004   K 4.2 04/01/2011 1251   CL 106 04/03/2012 1004   CL 108 04/01/2011 1251   CO2 30* 04/03/2012 1004   CO2 27 04/01/2011 1251   BUN 12.5 04/03/2012 1004   BUN 11 04/01/2011 1251   CREATININE 0.9 04/03/2012 1004   CREATININE 0.95 04/01/2011 1251      Component Value Date/Time   CALCIUM 9.3 04/03/2012 1004   CALCIUM 9.0 04/01/2011 1251   ALKPHOS 120 04/03/2012 1004   ALKPHOS 97 04/01/2011 1251   AST 13 04/03/2012 1004   AST 13 04/01/2011 1251   ALT 14 04/03/2012 1004   ALT 10 04/01/2011 1251   BILITOT 0.43 04/03/2012 1004   BILITOT 0.4 04/01/2011 1251       Radiological Studies: Dg Chest 2 View  04/03/2012  *RADIOLOGY REPORT*  Clinical Data: Lung cancer and tracheal cancer  CHEST - 2 VIEW  Comparison: 04/05/2011  Findings: Postop changes  on the left with left pleural scarring, unchanged.  Negative for heart failure.  Negative for infiltrate or effusion.  No mass lesion is identified.  IMPRESSION: Stable chest x-ray.   Original Report Authenticated By: Janeece Riggers, M.D.     Impression and Plan:  #1. Stage IB T2 N0 squamous carcinoma left lung treated with surgery alone. He remains free of any obvious recurrence now out 14-1/2 years from diagnosis in November 1999.  Plan continue annual followup exam and chest x-ray  #2 high-grade dysplasia distal trachea treated with laser surgery no new symptoms  #3. Superficial cancer of the bladder no evidence for  recurrence per his history. Ongoing urologic followup which she has been compliant with. #4. Status post resection of a cholesteatoma of the left ear.  #5. Status post excision squamous cell carcinoma skin of back  #6. Obstructive airway disease.  #7. Unexplained weight loss without any anemia or anorexia. He has now regained most of his lost weight. #8. Cerebrovascular disease with history of amaurosis fugax. Status post left carotid endarterectomy 11/28/2006.  #9. Advanced degenerative arthritis of spine and hips #10.New skin cancer dorsum left hand which will be excised within the next month.    CC:. Dr. Kirby Funk; Dr.Stovvoff, urology, [facility], MD 3/3/20143:39 PM

## 2012-04-05 ENCOUNTER — Telehealth: Payer: Self-pay | Admitting: *Deleted

## 2012-04-05 NOTE — Telephone Encounter (Signed)
Called home and spoke with wife as patient unable to hear on phone.  Let them know that CXR was normal.  She appreciated the phone call.

## 2012-04-05 NOTE — Telephone Encounter (Signed)
Message copied by Orbie Hurst on Wed Apr 05, 2012 11:59 AM ------      Message from: Chase Nguyen      Created: Mon Apr 03, 2012  7:48 PM       Call pt CXR normal ------

## 2012-06-23 ENCOUNTER — Other Ambulatory Visit (INDEPENDENT_AMBULATORY_CARE_PROVIDER_SITE_OTHER): Payer: Medicare Other | Admitting: *Deleted

## 2012-06-23 DIAGNOSIS — Z48812 Encounter for surgical aftercare following surgery on the circulatory system: Secondary | ICD-10-CM

## 2012-06-23 DIAGNOSIS — I6529 Occlusion and stenosis of unspecified carotid artery: Secondary | ICD-10-CM

## 2012-06-27 ENCOUNTER — Other Ambulatory Visit: Payer: Self-pay | Admitting: *Deleted

## 2012-06-27 DIAGNOSIS — Z48812 Encounter for surgical aftercare following surgery on the circulatory system: Secondary | ICD-10-CM

## 2012-06-28 ENCOUNTER — Ambulatory Visit: Payer: Medicare Other | Admitting: Neurosurgery

## 2012-06-28 ENCOUNTER — Other Ambulatory Visit: Payer: Medicare Other

## 2012-06-30 ENCOUNTER — Encounter: Payer: Self-pay | Admitting: Vascular Surgery

## 2013-03-02 ENCOUNTER — Other Ambulatory Visit: Payer: Self-pay | Admitting: Internal Medicine

## 2013-03-02 DIAGNOSIS — H547 Unspecified visual loss: Secondary | ICD-10-CM

## 2013-03-09 ENCOUNTER — Ambulatory Visit
Admission: RE | Admit: 2013-03-09 | Discharge: 2013-03-09 | Disposition: A | Discharge status: Home / Self Care | Payer: Medicare HMO | Source: Ambulatory Visit | Attending: Internal Medicine | Admitting: Internal Medicine

## 2013-03-09 DIAGNOSIS — H547 Unspecified visual loss: Secondary | ICD-10-CM

## 2013-03-13 ENCOUNTER — Encounter: Payer: Self-pay | Admitting: Neurology

## 2013-03-13 ENCOUNTER — Other Ambulatory Visit: Payer: Self-pay | Admitting: Neurology

## 2013-03-13 ENCOUNTER — Ambulatory Visit (INDEPENDENT_AMBULATORY_CARE_PROVIDER_SITE_OTHER): Payer: Medicare HMO | Admitting: Neurology

## 2013-03-13 ENCOUNTER — Other Ambulatory Visit: Payer: Self-pay | Admitting: Vascular Surgery

## 2013-03-13 ENCOUNTER — Telehealth: Payer: Self-pay | Admitting: Neurology

## 2013-03-13 VITALS — BP 94/54 | HR 64 | Temp 98.3°F | Ht 70.08 in | Wt 166.2 lb

## 2013-03-13 DIAGNOSIS — I6529 Occlusion and stenosis of unspecified carotid artery: Secondary | ICD-10-CM

## 2013-03-13 DIAGNOSIS — Z48812 Encounter for surgical aftercare following surgery on the circulatory system: Secondary | ICD-10-CM

## 2013-03-13 DIAGNOSIS — Z1389 Encounter for screening for other disorder: Secondary | ICD-10-CM

## 2013-03-13 DIAGNOSIS — G7001 Myasthenia gravis with (acute) exacerbation: Secondary | ICD-10-CM

## 2013-03-13 DIAGNOSIS — G7000 Myasthenia gravis without (acute) exacerbation: Secondary | ICD-10-CM

## 2013-03-13 LAB — CREATININE, SERUM: Creat: 0.71 mg/dL (ref 0.50–1.35)

## 2013-03-13 LAB — BUN: BUN: 9 mg/dL (ref 6–23)

## 2013-03-13 MED ORDER — PREDNISONE 10 MG PO TABS: 10.0000 mg | ORAL_TABLET | Freq: Every day | ORAL | Status: DC

## 2013-03-13 MED ORDER — PYRIDOSTIGMINE BROMIDE 60 MG PO TABS: 60.0000 mg | ORAL_TABLET | Freq: Three times a day (TID) | ORAL | Status: DC

## 2013-03-13 NOTE — Telephone Encounter (Signed)
ADRIAN FROM [facility] CALLED REGARDING AN ORDER THEY RECEIVED FOR PT.  PLEASE CALL HER AT [phone].   THANKS!

## 2013-03-13 NOTE — Patient Instructions (Addendum)
1. Start mestinon 60mg  three times daily 2. Start taking prednisone 10mg  daily 3. CT chest wwo contrast 4. Return to clinic in 2-weeks on 03/27/2013.  I have scheduled the CT scan for Friday, February 13 at 11:00.  Please arrive 15 minutes early.  Do not have anything other than liquids 4 hours prior to CT.  Please go downstairs to the lab for blood work today before leaving.

## 2013-03-13 NOTE — Progress Notes (Signed)
[facility] Clinic Note - Initial Visit   Date: 03/13/2013    Chase Nguyen MRN: [id number] DOB: 1935-12-09   Dear Dr Laurann Montana:  Thank you for your kind referral of Chase Nguyen for consultation of myasthenia gravis. Although his history is well known to you, please allow [place] to reiterate it for the purpose of our medical record. The patient was accompanied to the clinic by wife who also provides collateral information.     History of Present Illness: Chase Nguyen is a 78 y.o. 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  About two weeks later, he developed droopiness of his eyes.  Symptoms are slightly better in the morning and worse by evening.  He denies double vision, problems with talking, or shortness of breath.  His wife says that he has always tended to keep his neck flexed for several years, but it has worsened over the past several weeks.  He has occasional problems with swallowing.    He eventually saw his PCP who referred him to opthalmology where he saw Dr. Celesta Aver and recommended lab testing for myasthenia.  His acetylcholine receptor binding antibody return positive (1.77, normal <0.24), so he was referred here for evaluation.  Out-side paper records, electronic medical record, and images have been reviewed where available and summarized as:  Labs 03/05/2013:  AChR binding 1.77*, CRP 3.6, ESR 6  Past Medical History  Diagnosis Date  . Left inguinal hernia   . Tobacco abuse   . Hx of bladder cancer   .  Hyperlipemia   . HTN (hypertension)   . Bilateral cataracts   . Skin cancer     RIGHT EAR AND BACK  . Family hx of lung cancer   . Internal carotid artery occlusion   . Obstructive airway disease   . Degenerative arthritis of spine     AND HIPS  . 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  . Emphysema of lung   . Cancer of lower lobe of lung 04/05/2011  . COPD (chronic obstructive pulmonary disease)     Past Surgical History  Procedure Laterality Date  . L vats  11/ 30/ 1999  . Left lower lobectomy  05/09/1998  . Tympanoplasty  07/11/1998    WITH OSSICULOPLASTY/LEFT  . Flexible bronchoscopy w/ laser  05/22/1998,07/06/1999,09/21/1999,04/07/2000  . Video bronchoscopy  11/02/1998  . Myringotomy  07/06/1999    with left tube placement  . Cystoscopy,transurethral resection of bladder  12/22/1999  . Fiberoptic bronch,bx of bronchial stem,bx of distal tracheal lesion,laser of distal tracheal lesion  06/02/1999  . Right canal wall down tympanoplasty/mastoidectomy,,myringotomy  08/23/2001  . Cataract extraction       Medications:  Current Outpatient Prescriptions on File Prior to Visit  Medication Sig Dispense Refill  . amLODipine (NORVASC) 10 MG tablet Take 10 mg by mouth daily.        Marland Kitchen aspirin 325 MG tablet Take 325 mg by mouth daily.      . benazepril (LOTENSIN) 20 MG  tablet Take 20 mg by mouth daily.        . Multiple Vitamins-Minerals (PRESERVISION AREDS PO) Take 2 tablets by mouth daily.      . pravastatin (PRAVACHOL) 40 MG tablet Take 40 mg by mouth daily.         No current facility-administered medications on file prior to visit.    Allergies: No Known Allergies  Family History: Family History  Problem Relation Age of Onset  . Heart attack Father   . Cancer Mother   . Cancer Sister   . Stroke Sister     Social History: History   Social History  . Marital Status: Married    Spouse Name:  N/A    Number of Children: 1  . Years of Education: N/A   Occupational History  . retired    Social History Main Topics  . Smoking status: Current Every Day Smoker -- 1.00 packs/day    Types: Cigarettes    Last Attempt to Quit: 12/31/1997  . Smokeless tobacco: Former Systems developer  . Alcohol Use: No  . Drug Use: No  . Sexual Activity: Not on file   Other Topics Concern  . Not on file   Social History Narrative  . No narrative on file    Review of Systems:  CONSTITUTIONAL: No fevers, chills, night sweats, +weight loss.   EYES: +visual changes or eye pain ENT: +hearing changes.  No history of nose bleeds.   RESPIRATORY: No cough, wheezing +shortness of breath.   CARDIOVASCULAR: Negative for chest pain, and palpitations.  +swelling GI: Negative for abdominal discomfort, blood in stools or black stools.  No recent change in bowel habits.   GU:  No history of incontinence.   MUSCLOSKELETAL: No history of joint pain or swelling.  No myalgias.   SKIN: Negative for lesions, rash, and itching.   HEMATOLOGY/ONCOLOGY: Negative for prolonged bleeding, bruising easily, and swollen nodes.  + history of cancer.   ENDOCRINE: + cold or heat intolerance, polydipsia or goiter.   PSYCH:  No depression or anxiety symptoms.   NEURO: As Above.   Vital Signs:  BP 94/54  Pulse 64  Temp(Src) 98.3 F (36.8 C)  Ht 5' 10.08" (1.78 m)  Wt 166 lb 3 oz (75.382 kg)  BMI 23.79 kg/m2  Neurological Exam: MENTAL STATUS including orientation to time, place, person, recent and remote memory, attention span and concentration, language, and fund of knowledge is fair.  Speech is not dysarthric.  CRANIAL NERVES: II:  Blind in left eye, visual field intact in right eye.  Limited fundoscopic exam due to non-dilated exam and very small pupils. III-IV-VI: Pupils equal round and reactive to light.  Normal conjugate, extra-ocular eye movements in all directions of gaze.  No nystagmus.  Left > right ptosis worse with  upward gaze.   V:  Normal facial sensation.   VII:  Mild asymmetry with flattening of the right nasolabial fold.  Frontalis, oribicularis oculi, orbicularis oris is 5-/5.  Buccinator is 4+/5.    VIII:  Normal hearing and vestibular function.   IX-X:  Normal palatal movement.   XI:  Normal shoulder shrug and head rotation.   XII:  Normal tongue strength and range of motion, no deviation or fasciculation.  MOTOR:  No atrophy, fasciculations or abnormal movements.  No pronator drift.  Neck flexion 4+/5 Neck extension 5/5  Right Upper Extremity:    Left Upper Extremity:    Deltoid  4+/5   Deltoid  4+/5   Biceps  5/5   Biceps  5/5   Triceps  5/5   Triceps  5/5   Wrist extensors  5/5   Wrist extensors  5/5   Wrist flexors  5/5   Wrist flexors  5/5   Finger extensors  5/5   Finger extensors  5/5   Finger flexors  5/5   Finger flexors  5/5   Dorsal interossei  5/5   Dorsal interossei  5/5   Abductor pollicis  5/5   Abductor pollicis  5/5   Tone (Ashworth scale)  0+  Tone (Ashworth scale)  0   Right Lower Extremity:    Left Lower Extremity:    Hip flexors  4/5   Hip flexors  5-/5   Hip extensors  4/5   Hip extensors  5/5   Knee flexors  4+/5   Knee flexors  5/5   Knee extensors  4+/5   Knee extensors  5/5   Dorsiflexors  5-/5   Dorsiflexors  5/5   Plantarflexors  5-/5   Plantarflexors  5/5   Toe extensors  5/5   Toe extensors  5/5   Toe flexors  5/5   Toe flexors  5/5   Tone (Ashworth scale)  0  Tone (Ashworth scale)  0   MSRs:  Right                                                                 Left brachioradialis 3+  brachioradialis 2+  biceps 3+  biceps 2+  triceps 3+  triceps 2+  patellar 3+  patellar 2+  ankle jerk 2+  ankle jerk 2+  Hoffman no  Hoffman no  plantar response up  plantar response down   SENSORY:  Reduced sensation to all modalities over the right side.  COORDINATION/GAIT: Normal finger-to- nose-finger and heel-to-shin.  Intact rapid alternating movements  bilaterally.  Able to rise from a chair without using arms after three attempts.  Stooped posture with slightly wide-based gait and he is unsteady.   IMPRESSION: 1.  Generalized myasthenia gravis, newly diagnosed, AChR binding positive  - Clinically with symptoms manifesting with neck flexion weakness (4/5), ptosis, and changes in vision  - Pneumovax and influenza vaccine already received.  He has shingles several years ago.  - Extensive discussion regarding the diagnosis, prognosis, management plan, meds to avoid, and potential crisis. 2. History of stroke with residual right hemisensory loss  - on ASA, statin, ACEi 3. History of left AION 4. Carotid stenosis s/p L CEA (2008) 5. Current tobacco use, COPD  - Counseled for tobacco cessation 6. History of squamous cell carcinoma of the trachea, lung, bladder   PLAN/RECOMMENDATIONS:  1. Start mestinon 53m three times daily 2. Due to moderate weakness, I do not feel that mestinon alone will control his symptoms, so I will start him on prednisone 164mdaily - will need to be cautious with prednisone given his known history of cataracts.  Risks and benefits discussed. I will request that his PCP follow his blood glucose levels while on steroids.   3. Start taking calcium supplementation and pepcid for GI prophylaxis 4. CT chest wwo contrast 5. The patient was counseled extensively regarding the pertinent warning signs and symptoms of MG crisis (including significant swallowing and breathing  difficulty) that should prompt urgent medical evaluation since an exacerbation of myasthenia gravis is potentially life-threatening.   6. Return to clinic in 2-weeks on 03/27/2013.   The duration of this appointment visit was 60 minutes of face-to-face time with the patient.  Greater than 50% of this time was spent in counseling, explanation of diagnosis, planning of further management, and coordination of care.   Thank you for allowing me to participate in  patient's care.  If I can answer any additional questions, I would be pleased to do so.    Sincerely,    Donika K. Posey Pronto, DO

## 2013-03-13 NOTE — Telephone Encounter (Signed)
Called Chase Nguyen and she said I put in wrong order.  Put in new order for creatinine.

## 2013-03-16 ENCOUNTER — Ambulatory Visit (HOSPITAL_COMMUNITY)
Admission: RE | Admit: 2013-03-16 | Discharge: 2013-03-16 | Disposition: A | Discharge status: Home / Self Care | Payer: Medicare HMO | Source: Ambulatory Visit | Attending: Neurology | Admitting: Neurology

## 2013-03-16 DIAGNOSIS — I319 Disease of pericardium, unspecified: Secondary | ICD-10-CM | POA: Insufficient documentation

## 2013-03-16 DIAGNOSIS — J449 Chronic obstructive pulmonary disease, unspecified: Secondary | ICD-10-CM | POA: Insufficient documentation

## 2013-03-16 DIAGNOSIS — I700 Atherosclerosis of aorta: Secondary | ICD-10-CM | POA: Insufficient documentation

## 2013-03-16 DIAGNOSIS — J4489 Other specified chronic obstructive pulmonary disease: Secondary | ICD-10-CM | POA: Insufficient documentation

## 2013-03-16 DIAGNOSIS — I2510 Atherosclerotic heart disease of native coronary artery without angina pectoris: Secondary | ICD-10-CM | POA: Insufficient documentation

## 2013-03-16 DIAGNOSIS — G7000 Myasthenia gravis without (acute) exacerbation: Secondary | ICD-10-CM | POA: Insufficient documentation

## 2013-03-16 DIAGNOSIS — G7001 Myasthenia gravis with (acute) exacerbation: Secondary | ICD-10-CM

## 2013-03-29 ENCOUNTER — Ambulatory Visit: Payer: Medicare HMO | Admitting: Neurology

## 2013-03-31 ENCOUNTER — Telehealth: Payer: Self-pay | Admitting: *Deleted

## 2013-03-31 ENCOUNTER — Encounter: Payer: Self-pay | Admitting: Oncology

## 2013-03-31 NOTE — Telephone Encounter (Signed)
Mailed provider change letter w/ calendar to pt.  Cancelled Dr. Synthia Innocent appt.

## 2013-04-24 ENCOUNTER — Telehealth: Payer: Self-pay | Admitting: Neurology

## 2013-04-24 NOTE — Telephone Encounter (Signed)
Pt's PCP called for copy of CT chest report. Faxed as requested to [phone], attn Katie / Sherri S.

## 2013-04-30 ENCOUNTER — Encounter: Payer: Self-pay | Admitting: Neurology

## 2013-04-30 ENCOUNTER — Other Ambulatory Visit: Payer: Self-pay | Admitting: Neurology

## 2013-04-30 ENCOUNTER — Ambulatory Visit (INDEPENDENT_AMBULATORY_CARE_PROVIDER_SITE_OTHER): Payer: Commercial Managed Care - HMO | Admitting: Neurology

## 2013-04-30 VITALS — BP 89/55 | HR 66 | Wt 171.4 lb

## 2013-04-30 DIAGNOSIS — G7001 Myasthenia gravis with (acute) exacerbation: Secondary | ICD-10-CM

## 2013-04-30 DIAGNOSIS — I959 Hypotension, unspecified: Secondary | ICD-10-CM

## 2013-04-30 NOTE — Progress Notes (Signed)
Note and CT faxed.

## 2013-04-30 NOTE — Patient Instructions (Addendum)
1.  Your blood pressure was low at today's visit, please monitor it at home and follow-up with Dr. Delene Nguyen office if it remains low. 2.  Check blood work today 3.  Start taking pepcid  4.  Start calcium intake of 1200 mg/day and vitamin D intake of 800 IU/day 5.  Continue taking prednisone 10mg  daily 6.  Continue taking mestinon 60mg  three times day 7.  Return to clinic in 38-months

## 2013-04-30 NOTE — Progress Notes (Signed)
Follow-up Visit   Date: 04/30/2013    Chase Nguyen MRN: [id number] DOB: May 05, 1935   Interim History: Chase Nguyen is a 78 y.o. 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  The patient was accompanied to the clinic by wife who also provides collateral information.   He was last seen in the clinic on 03/13/2013.   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 About two weeks later, he developed droopiness of his eyes. Symptoms are slightly better in the morning and worse by evening. He denies double vision, problems with talking, or shortness of breath. His wife says that he has always tended to keep his neck flexed for several years, but it has worsened over the past several weeks. He has occasional problems with swallowing.  He eventually saw his PCP who referred him to opthalmology where he saw Dr. Celesta Aver and recommended lab testing for myasthenia. His acetylcholine receptor binding antibody return positive (1.77, normal <0.24).  He was initially seen in the clinic on 03/13/2013 at which time mestinon 76m TID and prednisone 117mwas started.  - Follow-up 04/30/2013:  They noticed significant improvement with ptosis after taking mestinon. His neck strength is also improved.  He is tolerating both prednisone and mestinon without any side effects.  He skipped his medications for the past two days, because he did not get it refilled.  No dysarthria, shortness of breath, or doubple vision.  I am very pleased that he quit smoking two months ago!   Medications:    Current Outpatient Prescriptions on File Prior to Visit  Medication Sig Dispense Refill  . amLODipine (NORVASC) 10 MG tablet Take 10 mg by mouth daily.        . Marland Kitchenspirin 325 MG tablet Take 325 mg by mouth daily.      . benazepril (LOTENSIN) 20 MG tablet Take 20 mg by mouth daily.        . Marland Kitchenevothyroxine (SYNTHROID, LEVOTHROID) 88 MCG tablet Take 88 mcg by mouth daily before breakfast.      . Multiple Vitamins-Minerals (PRESERVISION AREDS PO) Take 2 tablets by mouth daily.      . pravastatin (PRAVACHOL) 40 MG tablet Take 40 mg by mouth daily.        . predniSONE (DELTASONE) 10 MG tablet Take 1 tablet (10 mg total) by mouth daily with breakfast.  30 tablet  3  . pyridostigmine (MESTINON) 60 MG tablet Take 1 tablet (60 mg total) by mouth 3 (three) times daily.  90 tablet  3  . vitamin B-12 (CYANOCOBALAMIN) 100 MCG tablet Take 100 mcg by mouth daily.       No current facility-administered medications on file prior to visit.    Allergies: No Known Allergies   Review of Systems:  CONSTITUTIONAL: No fevers, chills, night sweats.  EYES: +visual changes or eye pain  ENT: +hearing changes. No history of nose bleeds.  RESPIRATORY: No cough, wheezing, shortness of breath.  CARDIOVASCULAR: Negative for chest pain, and palpitations.  GI: Negative for abdominal discomfort, blood in stools or black stools. No recent change in bowel habits.  GU: No history of incontinence.  MUSCLOSKELETAL: No history of joint pain or swelling. No myalgias.  SKIN: Negative for lesions, rash, and itching.  HEMATOLOGY/ONCOLOGY: Negative for prolonged bleeding, bruising easily, and swollen nodes. + history of cancer.  ENDOCRINE: + cold or heat intolerance, polydipsia or goiter.  PSYCH: No depression or anxiety symptoms.  NEURO: As Above.   Vital Signs:  BP 89/55  Pulse 66  Wt 171 lb 7 oz (77.764 kg)  SpO2 91%  Neurological Exam: MENTAL STATUS including orientation to time, place, person, recent and remote memory,  attention span and concentration, language, and fund of knowledge is normal.  Speech is not dysarthric.  CRANIAL NERVES: No visual field defects. Pupils equal round and reactive to light.  Normal conjugate, extra-ocular eye movements in all directions of gaze.  Mild left > right ptosis worse with upward gaze (able to look upward for 20 second).  Normal facial sensation.  Face is symmetric.  Frontalis, oribicularis oculi, orbicularis oris is 5/5. Buccinator is 5-/5.  Palate elevates symmetrically.  Tongue is midline with normal strength.  MOTOR: No atrophy, fasciculations or abnormal movements. No pronator drift.  Neck flexion 5/5  Neck extension 5/5  Right Upper Extremity:    Left Upper Extremity:    Deltoid  5/5   Deltoid  5/5   Biceps  5/5   Biceps  5/5   Triceps  5/5   Triceps  5/5   Wrist extensors  5/5   Wrist extensors  5/5   Wrist flexors  5/5   Wrist flexors  5/5   Finger extensors  5/5   Finger extensors  5/5   Finger flexors  5/5   Finger flexors  5/5   Dorsal interossei  5/5   Dorsal interossei  5/5   Abductor pollicis  5/5   Abductor pollicis  5/5   Tone (Ashworth scale)  0+   Tone (Ashworth scale)  0    Right Lower Extremity:    Left Lower Extremity:    Hip flexors  4/5   Hip flexors  5/5   Hip extensors  4/5   Hip extensors  5/5   Knee flexors  5/5   Knee flexors  5/5   Knee extensors  5/5   Knee extensors  5/5   Dorsiflexors  5/5   Dorsiflexors  5/5   Plantarflexors  5/5   Plantarflexors  5/5   Toe extensors  5/5   Toe extensors  5/5   Toe flexors  5/5   Toe flexors  5/5   Tone (Ashworth scale)  0   Tone (Ashworth scale)  0      MSRs:  Right      Left  brachioradialis  3+   brachioradialis  2+   biceps  3+   biceps  2+   triceps  3+   triceps  2+   patellar  3+   patellar  2+   ankle jerk  2+   ankle jerk  2+   Hoffman  no   Hoffman  no   plantar response  up   plantar response  down     SENSORY:  Reduced sensation to all modalities over the right  side  COORDINATION/GAIT:  Stooped posture with slightly wide-based gait and he is unsteady.  Data: Labs 03/05/2013: AChR binding 1.77*, CRP 3.6, ESR 6  CT chest 03/16/2013:   COPD changes with stable area of ground-glass opacification in the medial left upper lobe, nonspecific.  Tumor  not completely excluded, recommend followup imaging in 1 year.  Scattered nodular foci can be Re assessed at time of followup CT in 1 year.  Extensive atherosclerotic disease including coronary arteries.  Persistent pericardial effusion.   IMPRESSION/PLAN: 1. Generalized myasthenia gravis, AChR binding positive (dx 03/2013)  - Stable.  No thymoma   - Continue on prednisone 26m and mestinon 674mTID (started 03/13/2013). Encouraged him to take it daily.  - Check CMP to look at sugars as well as LFTs   - Start calcium intake of 1200 mg/day and vitamin D intake of 800 IU/day  - Start taking pepcid for GI prophylaxis  - I again discussed risks and benefits of being on steroids which he understands.    - Will try to keep him on the lowest dose as possible since he already has known cataracts 2. Low blood pressure reading  - Asymptomatic  - Encouraged him to monitor this at home and if it remains low, he should follow-up with PCP  - Recently stopped smoking and it may be possible that is contributing to lower BP 3. History of stroke with residual right hemisensory loss   - on ASA, statin, ACEi  4. History of left AION  5. Carotid stenosis s/p L CEA (2008)  6. COPD, praised him for stopping smoking 7. History of squamous cell carcinoma of the trachea, lung, bladder    - Recent CT chest with nodular foci and ground-glass opacification in left upper lobe  - Will forward results to Dr. GrDelene Ruffiniffice for ongoing surveillance   I will see him back in 2-31-monthor sooner as needed   The duration of this appointment visit was 30 minutes of face-to-face time with the patient.  Greater than 50% of this time was  spent in counseling, explanation of diagnosis, planning of further management, and coordination of care.   Thank you for allowing me to participate in patient's care.  If I can answer any additional questions, I would be pleased to do so.    Sincerely,    Donika K. PatPosey ProntoO

## 2013-05-01 LAB — COMPREHENSIVE METABOLIC PANEL
ALBUMIN: 3.6 g/dL (ref 3.5–5.2)
ALT: 12 U/L (ref 0–53)
AST: 10 U/L (ref 0–37)
Alkaline Phosphatase: 97 U/L (ref 39–117)
BUN: 10 mg/dL (ref 6–23)
CHLORIDE: 103 meq/L (ref 96–112)
CO2: 31 mEq/L (ref 19–32)
Calcium: 9 mg/dL (ref 8.4–10.5)
Creat: 0.75 mg/dL (ref 0.50–1.35)
Glucose, Bld: 93 mg/dL (ref 70–99)
POTASSIUM: 4 meq/L (ref 3.5–5.3)
SODIUM: 141 meq/L (ref 135–145)
Total Bilirubin: 0.5 mg/dL (ref 0.2–1.2)
Total Protein: 5.8 g/dL — ABNORMAL LOW (ref 6.0–8.3)

## 2013-05-22 ENCOUNTER — Other Ambulatory Visit: Payer: Medicare Other

## 2013-05-22 ENCOUNTER — Other Ambulatory Visit (HOSPITAL_BASED_OUTPATIENT_CLINIC_OR_DEPARTMENT_OTHER): Payer: Commercial Managed Care - HMO

## 2013-05-22 ENCOUNTER — Ambulatory Visit (HOSPITAL_BASED_OUTPATIENT_CLINIC_OR_DEPARTMENT_OTHER): Payer: Commercial Managed Care - HMO | Admitting: Internal Medicine

## 2013-05-22 ENCOUNTER — Ambulatory Visit: Payer: Commercial Managed Care - HMO | Admitting: Oncology

## 2013-05-22 ENCOUNTER — Encounter: Payer: Self-pay | Admitting: Internal Medicine

## 2013-05-22 VITALS — BP 100/45 | HR 52 | Temp 97.5°F | Resp 20 | Ht 70.0 in | Wt 173.2 lb

## 2013-05-22 DIAGNOSIS — Z85118 Personal history of other malignant neoplasm of bronchus and lung: Secondary | ICD-10-CM

## 2013-05-22 DIAGNOSIS — IMO0002 Reserved for concepts with insufficient information to code with codable children: Secondary | ICD-10-CM

## 2013-05-22 DIAGNOSIS — C33 Malignant neoplasm of trachea: Secondary | ICD-10-CM

## 2013-05-22 DIAGNOSIS — C4490 Unspecified malignant neoplasm of skin, unspecified: Secondary | ICD-10-CM

## 2013-05-22 LAB — COMPREHENSIVE METABOLIC PANEL (CC13)
ALBUMIN: 3.8 g/dL (ref 3.5–5.0)
ALK PHOS: 118 U/L (ref 40–150)
ALT: 15 U/L (ref 0–55)
AST: 18 U/L (ref 5–34)
Anion Gap: 13 mEq/L — ABNORMAL HIGH (ref 3–11)
BUN: 7.9 mg/dL (ref 7.0–26.0)
CALCIUM: 9.8 mg/dL (ref 8.4–10.4)
CO2: 28 mEq/L (ref 22–29)
Chloride: 108 mEq/L (ref 98–109)
Creatinine: 0.8 mg/dL (ref 0.7–1.3)
Glucose: 66 mg/dl — ABNORMAL LOW (ref 70–140)
Potassium: 3.9 mEq/L (ref 3.5–5.1)
Sodium: 149 mEq/L — ABNORMAL HIGH (ref 136–145)
Total Bilirubin: 0.81 mg/dL (ref 0.20–1.20)
Total Protein: 6.6 g/dL (ref 6.4–8.3)

## 2013-05-22 LAB — CBC WITH DIFFERENTIAL/PLATELET
BASO%: 0.3 % (ref 0.0–2.0)
Basophils Absolute: 0 10*3/uL (ref 0.0–0.1)
EOS%: 1.9 % (ref 0.0–7.0)
Eosinophils Absolute: 0.2 10*3/uL (ref 0.0–0.5)
HCT: 40.8 % (ref 38.4–49.9)
HGB: 13.4 g/dL (ref 13.0–17.1)
LYMPH#: 2.5 10*3/uL (ref 0.9–3.3)
LYMPH%: 26.7 % (ref 14.0–49.0)
MCH: 31.4 pg (ref 27.2–33.4)
MCHC: 32.8 g/dL (ref 32.0–36.0)
MCV: 95.7 fL (ref 79.3–98.0)
MONO#: 1.1 10*3/uL — ABNORMAL HIGH (ref 0.1–0.9)
MONO%: 11.5 % (ref 0.0–14.0)
NEUT#: 5.6 10*3/uL (ref 1.5–6.5)
NEUT%: 59.6 % (ref 39.0–75.0)
Platelets: 160 10*3/uL (ref 140–400)
RBC: 4.26 10*6/uL (ref 4.20–5.82)
RDW: 14.2 % (ref 11.0–14.6)
WBC: 9.4 10*3/uL (ref 4.0–10.3)

## 2013-05-22 NOTE — Progress Notes (Signed)
[facility] Telephone:([phone]) [phone]   Fax:([phone]  OFFICE PROGRESS NOTE  Irven Shelling, MD [employer], [address] [place] [address]  DIAGNOSIS:  #1. Stage IB T2 N0 squamous carcinoma left lung treated with surgery alone. He remains free of any obvious recurrence now out 14-1/2 years from diagnosis in November 1999.  Plan continue annual followup exam and chest x-ray  #2 high-grade dysplasia distal trachea treated with laser surgery no new symptoms  #3. Superficial cancer of the bladder no evidence for recurrence per his history. Ongoing urologic followup which she has been compliant with.  #4. Status post resection of a cholesteatoma of the left ear.  #5. Status post excision squamous cell carcinoma skin of back  #6. Obstructive airway disease.   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  CURRENT THERAPY: Observation.  [name] 78 y.o. male returns to the clinic today for followup visit accompanied by his wife. He is a former patient of Dr. Beryle Beams who is here today for evaluation and to establish care with me. The patient is feeling fine today with no specific complaints except for baseline shortness of breath increased with exertion, cough or hemoptysis. He denied having any significant weight loss or night sweats. The patient denied having any fever or chills, no nausea or vomiting. He had CT scan of the chest performed in April 2015 and he is here for evaluation and discussion of his scan results.  MEDICAL HISTORY: Past Medical History  Diagnosis Date  . Left inguinal hernia   . Tobacco abuse   . Hx of bladder cancer   . Hyperlipemia   . HTN (hypertension)   . Bilateral cataracts   . Skin cancer     RIGHT EAR AND BACK  . Family hx of lung cancer   . Internal carotid artery occlusion   . Obstructive airway  disease   . Degenerative arthritis of spine     AND HIPS  . 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  . Emphysema of lung   . Cancer of lower lobe of lung 04/05/2011  . COPD (chronic obstructive pulmonary disease)     ALLERGIES:  has No Known Allergies.  MEDICATIONS:  Current Outpatient Prescriptions  Medication Sig Dispense Refill  . amLODipine (NORVASC) 10 MG tablet Take 10 mg by mouth daily.        Marland Kitchen aspirin 325 MG tablet Take 325 mg by mouth daily.      . benazepril (LOTENSIN) 20 MG tablet Take 20 mg by mouth daily.        Marland Kitchen levothyroxine (SYNTHROID, LEVOTHROID) 88 MCG tablet Take 88 mcg by mouth daily before breakfast.      . Multiple Vitamins-Minerals (PRESERVISION AREDS PO) Take 2 tablets by mouth daily.      . pravastatin (PRAVACHOL) 40 MG tablet Take 40 mg by mouth daily.        . predniSONE (DELTASONE) 10 MG tablet Take 1 tablet (10 mg total) by mouth daily with breakfast.  30 tablet  3  . pyridostigmine (MESTINON) 60 MG tablet Take 1 tablet (60 mg total) by mouth 3 (three) times daily.  90 tablet  3  . vitamin B-12 (CYANOCOBALAMIN) 100 MCG tablet Take 100 mcg by mouth daily.       No current  facility-administered medications for this visit.    SURGICAL HISTORY:  Past Surgical History  Procedure Laterality Date  . L vats  11/ 30/ 1999  . Left lower lobectomy  05/09/1998  . Tympanoplasty  07/11/1998    WITH OSSICULOPLASTY/LEFT  . Flexible bronchoscopy w/ laser  05/22/1998,07/06/1999,09/21/1999,04/07/2000  . Video bronchoscopy  11/02/1998  . Myringotomy  07/06/1999    with left tube placement  . Cystoscopy,transurethral resection of bladder  12/22/1999  . Fiberoptic bronch,bx of bronchial stem,bx of distal tracheal lesion,laser of distal tracheal lesion  06/02/1999  . Right canal wall down tympanoplasty/mastoidectomy,,myringotomy  08/23/2001  . Cataract extraction      REVIEW OF SYSTEMS:  A  comprehensive review of systems was negative.   PHYSICAL EXAMINATION: General appearance: alert, cooperative and no distress Head: Normocephalic, without obvious abnormality, atraumatic Neck: no adenopathy, no JVD, supple, symmetrical, trachea midline and thyroid not enlarged, symmetric, no tenderness/mass/nodules Lymph nodes: Cervical, supraclavicular, and axillary nodes normal. Resp: clear to auscultation bilaterally Back: symmetric, no curvature. ROM normal. No CVA tenderness. Cardio: regular rate and rhythm, S1, S2 normal, no murmur, click, rub or gallop GI: soft, non-tender; bowel sounds normal; no masses,  no organomegaly Extremities: extremities normal, atraumatic, no cyanosis or edema Neurologic: Alert and oriented X 3, normal strength and tone. Normal symmetric reflexes. Normal coordination and gait  ECOG PERFORMANCE STATUS: 1 - Symptomatic but completely ambulatory  Blood pressure 100/45, pulse 52, temperature 97.5 F (36.4 C), temperature source Oral, resp. rate 20, height 5\' 10"  (1.778 m), weight 173 lb 3.2 oz (78.563 kg).  LABORATORY DATA: Lab Results  Component Value Date   WBC 9.4 05/22/2013   HGB 13.4 05/22/2013   HCT 40.8 05/22/2013   MCV 95.7 05/22/2013   PLT 160 05/22/2013      Chemistry      Component Value Date/Time   NA 149* 05/22/2013 0943   NA 141 04/30/2013 1134   K 3.9 05/22/2013 0943   K 4.0 04/30/2013 1134   CL 103 04/30/2013 1134   CL 106 04/03/2012 1004   CO2 28 05/22/2013 0943   CO2 31 04/30/2013 1134   BUN 7.9 05/22/2013 0943   BUN 10 04/30/2013 1134   CREATININE 0.8 05/22/2013 0943   CREATININE 0.75 04/30/2013 1134   CREATININE 0.95 04/01/2011 1251      Component Value Date/Time   CALCIUM 9.8 05/22/2013 0943   CALCIUM 9.0 04/30/2013 1134   ALKPHOS 118 05/22/2013 0943   ALKPHOS 97 04/30/2013 1134   AST 18 05/22/2013 0943   AST 10 04/30/2013 1134   ALT 15 05/22/2013 0943   ALT 12 04/30/2013 1134   BILITOT 0.81 05/22/2013 0943   BILITOT 0.5 04/30/2013 1134         RADIOGRAPHIC STUDIES:  CT CHEST WITH CONTRAST  TECHNIQUE:  Multidetector CT imaging of the chest was performed during  intravenous contrast administration. Sagittal and coronal MPR images  reconstructed from axial data set.  CONTRAST: 80 cc Omnipaque 300 IV  COMPARISON: 12/15/2010  FINDINGS:  Small to moderate-sized pericardial effusion.  Scattered coronary arterial and aortic atherosclerotic  calcifications.  Segment of abdominal aortic dissection is seen at the last image  number 73.  No aortic dissection.  Pulmonary arteries grossly patent on nondedicated exam.  No thoracic adenopathy or mediastinal mass.  Cyst at upper pole left kidney 20 x 17 mm.  Remaining visualized portion of upper abdomen normal appearance.  COPD changes with focus of ground-glass opacity in medial left upper  lobe image  19, unchanged.  This area measures approximately 1.9 x 1.3 cm in greatest dimensions  image 19.  Tiny subpleural nodular density right upper lobe image 24 not  definitely seen previously.  6 mm diameter nodule at minor fissure image 35 better visualized  than on previous exam though this could be related to differences in  slice averaging.  No additional mass, nodule, infiltrate or effusion.  Scarring at medial aspect of left lower lobe unchanged.  No acute osseous findings.  Mild chronic superior endplate height loss of a lower thoracic  vertebra is stable.  IMPRESSION:  COPD changes with stable area of ground-glass opacification in the  medial left upper lobe, nonspecific.  Tumor not completely excluded, recommend followup imaging in 1 year.  Scattered nodular foci can be Re assessed at time of followup CT in  1 year.  Extensive atherosclerotic disease including coronary arteries.  Persistent pericardial effusion.  Electronically Signed  By: Lavonia Dana M.D.  On: 03/16/2013 12:03  ASSESSMENT AND PLAN: This is a very pleasant 78 years old white male with history of stage  IB non-small cell lung cancer status post left lower lobectomy performed almost 15 years ago and has been observation since that time was no evidence for disease recurrence. His recent scan is unremarkable for any disease progression but showed persistent groundglass opacity in the left upper lobe. I discussed the scan results with the patient and his wife. I recommended for him to continue routine observation with his primary care physician Dr. Laurann Montana with annual chest x-ray. I don't see a need for the patient to continue routine followup visits with [place] at the [facility] at this point but will be happy to see him in the future if needed. The patient and his wife agreed to the current plan. He was advised to call immediately if he has any concerning symptoms.  The patient voices understanding of current disease status and treatment options and is in agreement with the current care plan.  All questions were answered. The patient knows to call the clinic with any problems, questions or concerns. We can certainly see the patient much sooner if necessary.  Disclaimer: This note was dictated with voice recognition software. Similar sounding words can inadvertently be transcribed and may not be corrected upon review.

## 2013-07-02 ENCOUNTER — Ambulatory Visit: Payer: Medicare HMO | Admitting: Neurology

## 2013-07-03 ENCOUNTER — Telehealth: Payer: Self-pay | Admitting: Neurology

## 2013-07-03 ENCOUNTER — Encounter: Payer: Self-pay | Admitting: Neurology

## 2013-07-03 ENCOUNTER — Other Ambulatory Visit (HOSPITAL_COMMUNITY): Payer: Medicare Other

## 2013-07-03 ENCOUNTER — Ambulatory Visit: Payer: Medicare Other | Admitting: Vascular Surgery

## 2013-07-03 NOTE — Telephone Encounter (Signed)
Pt no showed 07/02/13 appt w/ Dr. Posey Pronto. No show letter to patient / Sherri S.

## 2013-07-10 ENCOUNTER — Ambulatory Visit
Admission: RE | Admit: 2013-07-10 | Discharge: 2013-07-10 | Disposition: A | Discharge status: Home / Self Care | Payer: Commercial Managed Care - HMO | Source: Ambulatory Visit | Attending: Internal Medicine | Admitting: Internal Medicine

## 2013-07-10 ENCOUNTER — Other Ambulatory Visit: Payer: Self-pay | Admitting: Internal Medicine

## 2013-07-10 DIAGNOSIS — M545 Low back pain, unspecified: Secondary | ICD-10-CM

## 2013-07-11 ENCOUNTER — Encounter: Payer: Self-pay | Admitting: Vascular Surgery

## 2013-07-12 ENCOUNTER — Encounter: Payer: Self-pay | Admitting: Family

## 2013-07-12 ENCOUNTER — Ambulatory Visit (HOSPITAL_COMMUNITY)
Admission: RE | Admit: 2013-07-12 | Discharge: 2013-07-12 | Disposition: A | Discharge status: Home / Self Care | Payer: Medicare HMO | Source: Ambulatory Visit | Attending: Family | Admitting: Family

## 2013-07-12 ENCOUNTER — Ambulatory Visit (INDEPENDENT_AMBULATORY_CARE_PROVIDER_SITE_OTHER): Payer: Commercial Managed Care - HMO | Admitting: Family

## 2013-07-12 VITALS — BP 131/61 | HR 42 | Resp 16 | Ht 69.0 in | Wt 172.0 lb

## 2013-07-12 DIAGNOSIS — I6529 Occlusion and stenosis of unspecified carotid artery: Secondary | ICD-10-CM

## 2013-07-12 DIAGNOSIS — Z48812 Encounter for surgical aftercare following surgery on the circulatory system: Secondary | ICD-10-CM | POA: Insufficient documentation

## 2013-07-12 NOTE — Patient Instructions (Addendum)

## 2013-07-12 NOTE — Addendum Note (Signed)
Addended by: Mena Goes on: 07/12/2013 04:30 PM   Modules accepted: Orders

## 2013-07-12 NOTE — Progress Notes (Signed)
Established Carotid Patient   History of Present Illness  Chase Nguyen is a 78 y.o. male patient of Dr. Donnetta Hutching who is status post left CEA in October 2008. He returns today for follow up. He had a stroke, as manifested by loss of vision in his left eye, left leg weakness, no left arm weakness,  just before the 2008 left CEA, loss of vision in his left eye. He continues to have loss of vision in his left eye, left leg weakness. He denies aphasia. He has had no further known stroke or TIA symptoms.  Pt denies claudication symptoms with walking, denies non healing wounds.  Pt  reports New Medical or Surgical History: inflammation in his back and ptosis.  He does lots of gardening.  Pt Diabetic: No Pt smoker: former smoker, quit Feb, 2015  Pt meds include: Statin : Yes ASA: Yes Other anticoagulants/antiplatelets: no   Past Medical History  Diagnosis Date  . Left inguinal hernia   . Tobacco abuse   . Hx of bladder cancer   . Hyperlipemia   . HTN (hypertension)   . Bilateral cataracts   . Skin cancer     RIGHT EAR AND BACK  . Family hx of lung cancer   . Internal carotid artery occlusion   . Obstructive airway disease   . Degenerative arthritis of spine     AND HIPS  . 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  . Emphysema of lung   . Cancer of lower lobe of lung 04/05/2011  . COPD (chronic obstructive pulmonary disease)     Social History History  Substance Use Topics  . Smoking status: Former Smoker -- 1.00 packs/day for 71 years    Types: Cigarettes    Quit date: 03/04/2013  . Smokeless tobacco: Former Systems developer  . Alcohol Use: No    Family History Family History  Problem Relation Age of Onset  . Heart attack Father   . Cancer Mother   . Cancer Sister   . Stroke Sister     Surgical History Past Surgical History  Procedure Laterality Date  . L vats  11/ 30/ 1999  . Left lower lobectomy   05/09/1998  . Tympanoplasty  07/11/1998    WITH OSSICULOPLASTY/LEFT  . Flexible bronchoscopy w/ laser  05/22/1998,07/06/1999,09/21/1999,04/07/2000  . Video bronchoscopy  11/02/1998  . Myringotomy  07/06/1999    with left tube placement  . Cystoscopy,transurethral resection of bladder  12/22/1999  . Fiberoptic bronch,bx of bronchial stem,bx of distal tracheal lesion,laser of distal tracheal lesion  06/02/1999  . Right canal wall down tympanoplasty/mastoidectomy,,myringotomy  08/23/2001  . Cataract extraction      No Known Allergies  Current Outpatient Prescriptions  Medication Sig Dispense Refill  . HYDROcodone-acetaminophen (NORCO/VICODIN) 5-325 MG per tablet Take 1 tablet by mouth every 8 (eight) hours.      Marland Kitchen tiZANidine (ZANAFLEX) 4 MG capsule Take 4 mg by mouth every 8 (eight) hours.      Marland Kitchen amLODipine (NORVASC) 10 MG tablet Take 10 mg by mouth daily.        Marland Kitchen aspirin 325 MG tablet Take 325 mg by mouth daily.      . benazepril (LOTENSIN) 20 MG tablet Take 20 mg by mouth daily.        Marland Kitchen levothyroxine (SYNTHROID, LEVOTHROID) 88 MCG tablet Take 88 mcg by mouth daily before breakfast.      . Multiple Vitamins-Minerals (PRESERVISION  AREDS PO) Take 2 tablets by mouth daily.      . pravastatin (PRAVACHOL) 40 MG tablet Take 40 mg by mouth daily.        . predniSONE (DELTASONE) 10 MG tablet Take 1 tablet (10 mg total) by mouth daily with breakfast.  30 tablet  3  . pyridostigmine (MESTINON) 60 MG tablet Take 1 tablet (60 mg total) by mouth 3 (three) times daily.  90 tablet  3  . vitamin B-12 (CYANOCOBALAMIN) 100 MCG tablet Take 100 mcg by mouth daily.       No current facility-administered medications for this visit.    Review of Systems : See HPI for pertinent positives and negatives.  Physical Examination  Filed Vitals:   07/12/13 1354  BP: 131/61  Pulse: 42  Resp:     General: WDWN male in NAD GAIT: slow and deliberate Eyes: PERRLA Pulmonary:  Non-labored, CTAB, Negative   Rales, Negative rhonchi, & Negative wheezing.  Cardiac: regular Rhythm, detected murmur.  VASCULAR EXAM Carotid Bruits Left Right   Negative Positive    Aorta is not palpable. Radial pulses are 2+ palpable and equal.                                                                                                                        Gastrointestinal: soft, nontender, BS WNL, no r/g,  negative masses.  Musculoskeletal: Negative muscle atrophy/wasting. M/S 4/5 throughout, Extremities without ischemic changes. 1+ pitting edema in both ankles.  Neurologic: A&O X 3; Appropriate Affect ; SENSATION ;normal;  Speech is normal CN 2-12 intact except is hard of hearing, Pain and light touch intact in extremities, Motor exam as listed above.   Non-Invasive Vascular Imaging CAROTID DUPLEX 07/12/2013   CEREBROVASCULAR DUPLEX EVALUATION    INDICATION: Carotid artery disease     PREVIOUS INTERVENTION(S): Left carotid endarterectomy 11/28/2006.    DUPLEX EXAM:     RIGHT  LEFT  Peak Systolic Velocities (cm/s) End Diastolic Velocities (cm/s) Plaque LOCATION Peak Systolic Velocities (cm/s) End Diastolic Velocities (cm/s) Plaque  Occluded   CCA PROXIMAL 141 0   Occluded   CCA MID 161 0 HT  Occluded   CCA DISTAL 147 0 HM  70 0  ECA 201 0   Occluded   ICA PROXIMAL 137 13 HM  Occluded   ICA MID 109 12   Occluded   ICA DISTAL 84 10      ICA / CCA Ratio (PSV)   Antegrade  Vertebral Flow Not Visualized    664 Brachial Systolic Pressure (mmHg) 403  Triphasic  Brachial Artery Waveforms Triphasic     Plaque Morphology:  HM = Homogeneous, HT = Heterogeneous, CP = Calcific Plaque, SP = Smooth Plaque, IP = Irregular Plaque     ADDITIONAL FINDINGS:     IMPRESSION: Known occlusion of the right common and internal carotid arteries. Patent left carotid endarterectomy site with no evidence of hyperplasia or restenosis.     Compared to the previous  exam:  No significant change in comparison to the  last exam on 06/23/2012.     Assessment: Chase Nguyen is a 78 y.o. male who presents with asymptomatic known occlusion of the right common and internal carotid arteries. Patent left carotid endarterectomy site with no evidence of hyperplasia or restenosis.  No significant change in comparison to the last exam on 06/23/2012  Plan: Follow-up in 1 year with Carotid Duplex scan.   I discussed in depth with the patient the nature of atherosclerosis, and emphasized the importance of maximal medical management including strict control of blood pressure, blood glucose, and lipid levels, obtaining regular exercise, and continued cessation of smoking.  The patient is aware that without maximal medical management the underlying atherosclerotic disease process will progress, limiting the benefit of any interventions. The patient was given information about stroke prevention and what symptoms should prompt the patient to seek immediate medical care. Thank you for allowing [place] to participate in this patient's care.  Clemon Chambers, RN, MSN, FNP-C [facility] Office: [facility] Physician: Oneida Alar  07/12/2013 1:59 PM

## 2013-07-16 ENCOUNTER — Other Ambulatory Visit: Payer: Self-pay | Admitting: Internal Medicine

## 2013-07-16 ENCOUNTER — Ambulatory Visit (INDEPENDENT_AMBULATORY_CARE_PROVIDER_SITE_OTHER): Payer: Medicare HMO | Admitting: Neurology

## 2013-07-16 ENCOUNTER — Encounter: Payer: Self-pay | Admitting: Neurology

## 2013-07-16 VITALS — BP 124/76 | HR 60 | Ht 69.29 in | Wt 173.4 lb

## 2013-07-16 DIAGNOSIS — M545 Low back pain, unspecified: Secondary | ICD-10-CM

## 2013-07-16 DIAGNOSIS — G7001 Myasthenia gravis with (acute) exacerbation: Secondary | ICD-10-CM

## 2013-07-16 DIAGNOSIS — M533 Sacrococcygeal disorders, not elsewhere classified: Secondary | ICD-10-CM

## 2013-07-16 MED ORDER — PYRIDOSTIGMINE BROMIDE 60 MG PO TABS: 60.0000 mg | ORAL_TABLET | Freq: Three times a day (TID) | ORAL | Status: DC

## 2013-07-16 MED ORDER — PREDNISONE 10 MG PO TABS: 10.0000 mg | ORAL_TABLET | Freq: Every day | ORAL | Status: DC

## 2013-07-16 NOTE — Progress Notes (Signed)
Note faxed.

## 2013-07-16 NOTE — Progress Notes (Signed)
Follow-up Visit   Date: 07/16/2013    Chase Nguyen MRN: [id number] DOB: [birth year]   Interim History: Chase Nguyen is a 78 y.o. 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  The patient was accompanied to the clinic by wife who also provides collateral information.   He was last seen in the clinic on 04/30/2013.  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 About two weeks later, he developed droopiness of his eyes. Symptoms are slightly better in the morning and worse by evening. He denies double vision, problems with talking, or shortness of breath. His wife says that he has always tended to keep his neck flexed for several years, but it has worsened over the past several weeks. He has occasional problems with swallowing.   He eventually saw his PCP who referred him to opthalmology where he saw Dr. Celesta Aver and recommended lab testing for myasthenia. His acetylcholine receptor binding antibody return positive (1.77, normal <0.24).  He was initially seen in the clinic on 03/13/2013 at which time mestinon 44m TID and prednisone 175mwas started.  - Follow-up 04/30/2013:  They noticed significant improvement with ptosis after taking mestinon. His neck strength is also improved.  He is tolerating both prednisone and mestinon without any side effects.  He skipped his medications for the past two days, because he did not get it refilled.  No dysarthria, shortness of breath, or doubple vision.  Quit smoking in January!  - Follow-up 07/16/2013:  He denies any ongoing  problems with ptosis.  No diplopia, dysarthria, dysphagia, or generalized weakness.  About 2 weeks ago, he was helping his brother-in-law cut metal and lifting things, but nothing heavier than usual, but since then he developed severe low back pain.  Pain is described as sharp knife-life in the low back region (central), without radiation into his leg.  He denies any numbness/tingling, bowel/bladder incontinence.  His walking if affected, because he needs to be more cautious.  Laying flat helps his pain. Walking up a hill, tends to exacerbate pain.  He saw his PCP last week and was started on vicodin and zanaflex which does helps very slightly. He is scheduled to see his PCP later today for ortho referral.   Medications:  Current Outpatient Prescriptions on File Prior to Visit  Medication Sig Dispense Refill  . amLODipine (NORVASC) 10 MG tablet Take 10 mg by mouth daily.        . Marland Kitchenspirin 325 MG tablet Take 325 mg by mouth daily.      . benazepril (LOTENSIN) 20 MG tablet Take 20 mg by mouth daily.        . Marland KitchenYDROcodone-acetaminophen (NORCO/VICODIN) 5-325 MG per tablet Take 1 tablet by mouth every 8 (eight) hours.      . Marland Kitchenevothyroxine (SYNTHROID, LEVOTHROID) 88 MCG tablet Take 88 mcg by mouth daily before breakfast.      . Multiple Vitamins-Minerals (PRESERVISION AREDS PO) Take 2 tablets by mouth daily.      . pravastatin (PRAVACHOL) 40 MG tablet Take 40 mg by mouth daily.        .Marland Kitchen  tiZANidine (ZANAFLEX) 4 MG capsule Take 4 mg by mouth every 8 (eight) hours.      . vitamin B-12 (CYANOCOBALAMIN) 100 MCG tablet Take 100 mcg by mouth daily.       No current facility-administered medications on file prior to visit.    Allergies: No Known Allergies   Review of Systems:  CONSTITUTIONAL: No fevers, chills, night sweats.  EYES: +visual changes or eye pain  ENT: +hearing changes. No history of nose bleeds.  RESPIRATORY: No cough, wheezing, shortness of breath.  CARDIOVASCULAR: Negative for chest pain,  and palpitations.  GI: Negative for abdominal discomfort, blood in stools or black stools. No recent change in bowel habits.  GU: No history of incontinence.  MUSCLOSKELETAL: ++ history of joint pain or swelling. ++myalgias.  SKIN: Negative for lesions, rash, and itching.  HEMATOLOGY/ONCOLOGY: Negative for prolonged bleeding, bruising easily, and swollen nodes. + history of cancer.  ENDOCRINE: No cold or heat intolerance, polydipsia or goiter.  PSYCH: No depression or anxiety symptoms.  NEURO: As Above.   Vital Signs:  BP 124/76  Pulse 60  Ht 5' 9.29" (1.76 m)  Wt 173 lb 6 oz (78.642 kg)  BMI 25.39 kg/m2  SpO2 99%  Neurological Exam: MENTAL STATUS including orientation to time, place, person, recent and remote memory, attention span and concentration, language, and fund of knowledge is normal.  Speech is not dysarthric.  CRANIAL NERVES:  Pupils equal round and reactive to light.  Normal conjugate, extra-ocular eye movements in all directions of gaze.  No ptosis with sustained upward gaze - able to look upward for >30 second).   Face is symmetric.  Frontalis, oribicularis oculi, orbicularis oris is 5/5. Buccinator is 5-/5.  Palate elevates symmetrically.  Tongue is midline with normal strength.  MOTOR: Neck flexion and neck extension is full strength and intact, although he maintains a slightly flexed posture at baseline. Motor strength of upper extremities is 5/5 bilaterally. There is very slight increase in tone in the right upper extremity. Strength testing of the lower extremities is greatly hampered by pain - he is at least antigravity in the right lower extremity with a full-strength distally, and 5/5 motor strength on the left lower extremity.   MSRs:  Right      Left  brachioradialis  3+   brachioradialis  2+   biceps  3+   biceps  2+   triceps  3+   triceps  2+   patellar  3+   patellar  2+   ankle jerk  2+   ankle jerk  2+   Hoffman  no   Hoffman  no   plantar response  up    plantar response  down    SENSORY:  Reduced sensation to vibration over the right side  COORDINATION/GAIT: Gait is antalgic, slow, cautious, and unsteady   Data: Labs 03/05/2013: AChR binding 1.77*, CRP 3.6, ESR 6   IMPRESSION/PLAN: 1. Generalized myasthenia gravis, AChR binding positive (dx 03/2013)  - Stable with improved ptosis bilaterally.  No thymoma   - Continue on prednisone 72m and mestinon 691mTID (started 03/13/2013).  - I would like to taper his prednisone if he remains stable at his next visit  - Continue calcium intake of 1200 mg/day and vitamin D intake of 800 IU/day  - Continue taking pepcid for GI prophylaxis  2. Low back pain   - Severity of pain is concerning for lumbar disc herniation/radiculopathy versus musculoskeletal strain  - He is already taking muscle  relaxants without significant improvement  - PCP addressing this issue, I would recommend MRI lumbosacral region and/or physical therapy  - Recommended using a cane or walker for gait assistance due to pain and instability 3. History of stroke with residual right hemisensory loss   - on ASA, statin, ACEi  4. History of left AION  5. Carotid stenosis s/p L CEA (2008)  6. COPD 7. History of squamous cell carcinoma of the trachea, lung, bladder    I will see him back in 35-month, or sooner as needed   The duration of this appointment visit was 30 minutes of face-to-face time with the patient.  Greater than 50% of this time was spent in counseling, explanation of diagnosis, planning of further management, and coordination of care.   Thank you for allowing me to participate in patient's care.  If I can answer any additional questions, I would be pleased to do so.    Sincerely,    Rayhaan Huster K. PPosey Pronto DO

## 2013-07-16 NOTE — Patient Instructions (Addendum)
1. Continue mestinon 60mg  three times daily 2. Continue prednisone 10mg  daily 3. Follow-up with PCP regarding low back pain, if there is anything my office can help with, please let me know.  4. Strongly recommended using cane or walker for support 4. Return to clinic in 28-months or sooner as needed

## 2013-07-17 ENCOUNTER — Ambulatory Visit
Admission: RE | Admit: 2013-07-17 | Discharge: 2013-07-17 | Disposition: A | Discharge status: Home / Self Care | Payer: Commercial Managed Care - HMO | Source: Ambulatory Visit | Attending: Internal Medicine | Admitting: Internal Medicine

## 2013-07-17 DIAGNOSIS — M545 Low back pain, unspecified: Secondary | ICD-10-CM

## 2013-10-17 ENCOUNTER — Encounter: Payer: Self-pay | Admitting: Neurology

## 2013-10-17 ENCOUNTER — Ambulatory Visit (INDEPENDENT_AMBULATORY_CARE_PROVIDER_SITE_OTHER): Payer: Commercial Managed Care - HMO | Admitting: Neurology

## 2013-10-17 VITALS — BP 134/90 | HR 54 | Ht 69.0 in | Wt 173.2 lb

## 2013-10-17 DIAGNOSIS — Z79899 Other long term (current) drug therapy: Secondary | ICD-10-CM

## 2013-10-17 DIAGNOSIS — Z5181 Encounter for therapeutic drug level monitoring: Secondary | ICD-10-CM

## 2013-10-17 DIAGNOSIS — G7001 Myasthenia gravis with (acute) exacerbation: Secondary | ICD-10-CM

## 2013-10-17 LAB — COMPREHENSIVE METABOLIC PANEL
ALK PHOS: 90 U/L (ref 39–117)
ALT: 9 U/L (ref 0–53)
AST: 11 U/L (ref 0–37)
Albumin: 4 g/dL (ref 3.5–5.2)
BILIRUBIN TOTAL: 0.6 mg/dL (ref 0.2–1.2)
BUN: 12 mg/dL (ref 6–23)
CO2: 26 mEq/L (ref 19–32)
Calcium: 9.3 mg/dL (ref 8.4–10.5)
Chloride: 105 mEq/L (ref 96–112)
Creat: 0.81 mg/dL (ref 0.50–1.35)
Glucose, Bld: 66 mg/dL — ABNORMAL LOW (ref 70–99)
Potassium: 4.2 mEq/L (ref 3.5–5.3)
SODIUM: 146 meq/L — AB (ref 135–145)
TOTAL PROTEIN: 6 g/dL (ref 6.0–8.3)

## 2013-10-17 LAB — CBC
HCT: 40.4 % (ref 39.0–52.0)
HEMOGLOBIN: 13.3 g/dL (ref 13.0–17.0)
MCH: 31.3 pg (ref 26.0–34.0)
MCHC: 32.9 g/dL (ref 30.0–36.0)
MCV: 95.1 fL (ref 78.0–100.0)
Platelets: 214 10*3/uL (ref 150–400)
RBC: 4.25 MIL/uL (ref 4.22–5.81)
RDW: 14.2 % (ref 11.5–15.5)
WBC: 8.3 10*3/uL (ref 4.0–10.5)

## 2013-10-17 MED ORDER — PREDNISONE 1 MG PO TABS: ORAL_TABLET | ORAL | Status: DC

## 2013-10-17 MED ORDER — PYRIDOSTIGMINE BROMIDE 60 MG PO TABS: 60.0000 mg | ORAL_TABLET | Freq: Three times a day (TID) | ORAL | Status: DC

## 2013-10-17 NOTE — Progress Notes (Signed)
Note faxed.

## 2013-10-17 NOTE — Patient Instructions (Addendum)
1.  Take prednisone as follows:  5mg  (1/2 pill of 10mg  tablet) + 4mg  (4 pills of 1mg  tablets) = 9mg .   Please pay attention to the dosage of the medications since the look similar  2.  Continue mestinon 60mg  three times daily  3.  Continue calcium intake of 1200 mg/day and vitamin D intake of 800 IU/day  4.  Check blood work today  5.  Return to clinic 4-week

## 2013-10-17 NOTE — Progress Notes (Signed)
Follow-up Visit   Date: 10/17/2013    Chase Nguyen MRN: [id number] DOB: 1935-03-08   Interim History: Chase Nguyen is a 78 y.o. 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  The patient was accompanied to the clinic by wife who also provides collateral information.    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 About two weeks later, he developed droopiness of his eyes. Symptoms are slightly better in the morning and worse by evening. He denies double vision, problems with talking, or shortness of breath. His wife says that he has always tended to keep his neck flexed for several years, but it has worsened over the past several weeks. He has occasional problems with swallowing.   He eventually saw his PCP who referred him to opthalmology where he saw Dr. Celesta Aver and recommended lab testing for myasthenia. His acetylcholine receptor binding antibody return positive (1.77, normal <0.24).  He was initially seen in the clinic on 03/13/2013 at which time mestinon 85m TID and prednisone 161mwas started.  - Follow-up 04/30/2013:  Significant improvement with ptosis and neck strength after taking mestinon. He is tolerating both prednisone and mestinon without any side effects.  He skipped his medications for the past two days, because he did not get it refilled.  No dysarthria, shortness of breath, or doubple vision.  Quit smoking in January!  - Follow-up 07/16/2013:  He denies any ongoing problems with ptosis.  About 2 weeks ago, he was helping his brother-in-law cut metal and  lifting things, but nothing heavier than usual, but since then he developed severe low back pain.  Pain is described as sharp knife-life in the low back region (central), without radiation into his leg.  He saw his PCP last week and was started on vicodin and zanaflex which does helps very slightly. He is scheduled to see his PCP later today for ortho referral.  UPDATE 10/17/2013: Overall, from myasthenia stand-point, he has been doing fairly well with no new symptoms of ptosis, diplopia, dysarthria, dysphagia, or generalized weakness. He recently was found to have T12 compression fracture and has severe osteoporosis.  No new complaints.   Medications:  Current Outpatient Prescriptions on File Prior to Visit  Medication Sig Dispense Refill  . amLODipine (NORVASC) 10 MG tablet Take 10 mg by mouth daily.        . Marland Kitchenspirin 325 MG tablet Take 325 mg by mouth daily.      . benazepril (LOTENSIN) 20 MG tablet Take 20 mg by mouth daily.        . Marland KitchenYDROcodone-acetaminophen (NORCO/VICODIN) 5-325 MG per tablet Take 1 tablet by mouth every 8 (eight) hours.      . Marland Kitchenevothyroxine (SYNTHROID, LEVOTHROID) 88 MCG tablet Take 88 mcg by mouth daily before breakfast.      . Multiple Vitamins-Minerals (PRESERVISION AREDS PO) Take 2 tablets by mouth daily.      . pravastatin (PRAVACHOL) 40 MG tablet Take 40 mg by mouth daily.        . predniSONE (DELTASONE) 10 MG tablet Take 1 tablet (10 mg total) by mouth daily with  breakfast.  30 tablet  3  . pyridostigmine (MESTINON) 60 MG tablet Take 1 tablet (60 mg total) by mouth 3 (three) times daily.  90 tablet  3  . tiZANidine (ZANAFLEX) 4 MG capsule Take 4 mg by mouth every 8 (eight) hours.      . vitamin B-12 (CYANOCOBALAMIN) 100 MCG tablet Take 100 mcg by mouth daily.       No current facility-administered medications on file prior to visit.    Allergies: No Known Allergies   Review of Systems:  CONSTITUTIONAL: No fevers, chills, night sweats.  EYES: +visual changes or  eye pain  ENT: +hearing changes. No history of nose bleeds.  RESPIRATORY: No cough, wheezing, shortness of breath.  CARDIOVASCULAR: Negative for chest pain, and palpitations.  GI: Negative for abdominal discomfort, blood in stools or black stools. No recent change in bowel habits.  GU: No history of incontinence.  MUSCLOSKELETAL: ++ history of joint pain or swelling. ++myalgias.  SKIN: Negative for lesions, rash, and itching.  HEMATOLOGY/ONCOLOGY: Negative for prolonged bleeding, bruising easily, and swollen nodes. + history of cancer.  ENDOCRINE: No cold or heat intolerance, polydipsia or goiter.  PSYCH: No depression or anxiety symptoms.  NEURO: As Above.   Vital Signs:  BP 134/90  Pulse 54  Ht 5' 9"  (1.753 m)  Wt 173 lb 4 oz (78.586 kg)  BMI 25.57 kg/m2  SpO2 98%  Neurological Exam: MENTAL STATUS including orientation to time, place, person, recent and remote memory, attention span and concentration, language, and fund of knowledge is normal.  Speech is mildly dysarthric (old).  CRANIAL NERVES:  Pupils equal round and reactive to light.  Normal conjugate, extra-ocular eye movements in all directions of gaze.  No ptosis with sustained upward gaze - able to look upward for >30 second).   Face is symmetric.  Frontalis, oribicularis oculi, orbicularis oris is 5/5. Buccinator is 5-/5.  Palate elevates symmetrically.  Tongue is midline with normal strength.  MOTOR: Neck flexion and neck extension is full strength and intact, although he maintains a slightly flexed posture at baseline. Motor strength of upper extremities is 5/5 bilaterally. There is very slight increase in tone in the right upper extremity. Strength testing of the lower extremities is hampered by pain - he is at least antigravity in the right lower extremity with a full-strength distally, and 5/5 motor strength on the left lower extremity.   MSRs:  Right      Left  brachioradialis  3+   brachioradialis  2+   biceps  3+    biceps  2+   triceps  3+   triceps  2+   patellar  3+   patellar  2+   ankle jerk  2+   ankle jerk  2+   Hoffman  no   Hoffman  no   plantar response  up   plantar response  down    SENSORY:  Reduced sensation to vibration over the right side  COORDINATION/GAIT: Gait is antalgic, slow, cautious, and unsteady   Data: Labs 03/05/2013: AChR binding 1.77*, CRP 3.6, ESR 6  MRI lumbar spine wo contrast 07/17/2013: 1. Acute to subacute T12 compression fracture with mild loss of height. No retropulsion of bone. A benign osteoporotic fracture is  favored over pathologic fracture (e.g. due to T12 metastasis in this setting).  2. Meet marrow edema in the lower lumbar spine including at L5-S1 is favored to be degenerative in nature. No definite lumbar metastatic disease.  3. Chronically advanced  lumbar spine degeneration, stable since 2011 except at L5-S1 where bilateral lateral recess stenosis appears progressed.   IMPRESSION/PLAN: 1. Generalized myasthenia gravis, AChR binding positive, thymoma negative (dx 03/2013)    He has remained stable on prednisone 47m, so I will slowly taper him to 920m His severe osteopenia with known spine compression fractures is another reason to try to keep him on the lowest dose of prednisone  I discussed that if he will need long-term prednisone, I would prefer to choose steroid-sparing agent, such as imuran/cellcept so baseline screening labs including CBC and CMP will be ordered today   I will follow him close and going forward, if there is no worsening of MG, I will continue slow taper of prednisone.  On the other hand, if there is worsening of symptoms, steroid-sparing agent will be started  Continue mestinon 6079mID  Continue calcium intake of 1200 mg/day and vitamin D intake of 800 IU/day  Continue taking pepcid for GI prophylaxis  2. Low back pain   - Due to T12 compression fracture  - Recommended using a cane or walker for gait assistance due to  pain and instability 3. History of stroke with residual right hemisensory loss and right leg weakness  - on ASA, statin, ACEi  4. History of left AION  5. Carotid stenosis s/p L CEA (2008)  6. COPD 7. History of squamous cell carcinoma of the trachea, lung, bladder    I will see him back in 1-m51-month sooner as needed   The duration of this appointment visit was 30 minutes of face-to-face time with the patient.  Greater than 50% of this time was spent in counseling, explanation of diagnosis, planning of further management, and coordination of care.   Thank you for allowing me to participate in patient's care.  If I can answer any additional questions, I would be pleased to do so.    Sincerely,    Donika K. PatePosey Pronto

## 2013-11-16 ENCOUNTER — Encounter: Payer: Self-pay | Admitting: Neurology

## 2013-11-16 ENCOUNTER — Ambulatory Visit (INDEPENDENT_AMBULATORY_CARE_PROVIDER_SITE_OTHER): Payer: Commercial Managed Care - HMO | Admitting: Neurology

## 2013-11-16 VITALS — BP 124/64 | HR 54 | Ht 69.0 in | Wt 174.1 lb

## 2013-11-16 DIAGNOSIS — Z79899 Other long term (current) drug therapy: Secondary | ICD-10-CM

## 2013-11-16 DIAGNOSIS — G7000 Myasthenia gravis without (acute) exacerbation: Secondary | ICD-10-CM

## 2013-11-16 NOTE — Progress Notes (Signed)
Follow-up Visit   Date: 11/16/2013    Chase Nguyen MRN: [id number] DOB: 08-Jun-1935   Interim History: Chase Nguyen is a 78 y.o. 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  The patient was accompanied to the clinic by wife who also provides collateral information.    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 About two weeks later, he developed droopiness of his eyes. Symptoms are slightly better in the morning and worse by evening. He denies double vision, problems with talking, or shortness of breath. His wife says that he has always tended to keep his neck flexed for several years, but it has worsened over the past several weeks. He has occasional problems with swallowing.   He eventually saw his PCP who referred him to opthalmology where he saw Dr. Celesta Aver and recommended lab testing for myasthenia. His acetylcholine receptor binding antibody return positive (1.77, normal <0.24).  He was initially seen in the clinic on 03/13/2013 at which time mestinon 72m TID and prednisone 118mwas started.  - Follow-up 04/30/2013:  Significant improvement with ptosis and neck strength after taking mestinon. He is tolerating both prednisone and mestinon without any side effects.  He skipped his medications for the past two days, because he did not get it refilled.  No dysarthria, shortness of breath, or doubple vision.  Quit smoking in January!  - Follow-up 07/16/2013:  He denies any ongoing problems with ptosis.  About 2 weeks ago, he was helping his brother-in-law cut metal and  lifting things, but nothing heavier than usual, but since then he developed severe low back pain.  Pain is described as sharp knife-life in the low back region (central), without radiation into his leg.  He saw his PCP last week and was started on vicodin and zanaflex which does helps very slightly.   - Follow-up 10/17/2013: Overall, from myasthenia stand-point, he has been doing fairly well.  He recently was found to have T12 compression fracture and has severe osteoporosis.   UPDATE 11/16/2013:   No new complaints with tapering his prednisone to 78m32m Denies ptosis, diplopia, dysarthria, dysphagia, or generalized weakness.    Medications:  Current Outpatient Prescriptions on File Prior to Visit  Medication Sig Dispense Refill  . amLODipine (NORVASC) 10 MG tablet Take 10 mg by mouth daily.       . aMarland Kitchenpirin 325 MG tablet Take 325 mg by mouth daily.      . benazepril (LOTENSIN) 20 MG tablet Take 20 mg by mouth daily.        . lMarland Kitchenvothyroxine (SYNTHROID, LEVOTHROID) 88 MCG tablet Take 88 mcg by mouth daily before breakfast.      . Multiple Vitamins-Minerals (PRESERVISION AREDS PO) Take 2 tablets by mouth daily.      . pravastatin (PRAVACHOL) 40 MG tablet Take 40 mg by mouth daily.        . predniSONE (DELTASONE) 1 MG tablet Take total of 78mg40mily.  150 tablet  3  . pyridostigmine (MESTINON) 60 MG tablet Take 1 tablet (60 mg total) by mouth 3 (three) times daily.  90 tablet  5  . tiZANidine (ZANAFLEX) 4 MG capsule Take 4 mg by mouth every 8 (eight) hours.      . vitamin B-12 (CYANOCOBALAMIN) 100 MCG tablet Take 100 mcg by mouth daily.       No current facility-administered medications on file prior to visit.    Allergies: No Known Allergies   Review of Systems:  CONSTITUTIONAL: No fevers, chills, night sweats.  EYES: +visual changes or eye pain  ENT: +hearing changes. No history of nose bleeds.  RESPIRATORY: No cough, wheezing, shortness of breath.  CARDIOVASCULAR: Negative for chest pain,  and palpitations.  GI: Negative for abdominal discomfort, blood in stools or black stools. No recent change in bowel habits.  GU: No history of incontinence.  MUSCLOSKELETAL: + history of joint pain or swelling. No myalgias.  SKIN: Negative for lesions, rash, and itching.  HEMATOLOGY/ONCOLOGY: Negative for prolonged bleeding, bruising easily, and swollen nodes. + history of cancer.  ENDOCRINE: No cold or heat intolerance, polydipsia or goiter.  PSYCH: No depression or anxiety symptoms.  NEURO: As Above.   Vital Signs:  BP 124/64  Pulse 54  Ht 5' 9"  (1.753 m)  Wt 174 lb 2 oz (78.983 kg)  BMI 25.70 kg/m2  SpO2 97%  Neurological Exam: MENTAL STATUS including orientation to time, place, person is normal.  Speech is mildly dysarthric (old).  CRANIAL NERVES:  Pupils equal round and reactive to light.  Normal conjugate, extra-ocular eye movements in all directions of gaze.  No ptosis with sustained upward gaze - able to look upward for >30 second).   Face is symmetric.  Frontalis, oribicularis oculi, orbicularis oris is 5/5. Buccinator is 5-/5.  Palate elevates symmetrically.  Tongue is midline with normal strength.  MOTOR: Neck flexion and neck extension is full strength and intact, although he maintains a slightly flexed posture at baseline. Motor strength of upper extremities is 5/5 bilaterally. There is very slight increase in tone in the right upper extremity. Residual weakness of the right lower extremity from old stroke, RLE is antigravity.  LLE is 5/5  COORDINATION/GAIT: Gait shows stooped posture, he is dragging the right leg.    Data: Labs 03/05/2013: AChR binding 1.77*, CRP 3.6, ESR 6  MRI lumbar spine wo contrast 07/17/2013: 1. Acute to subacute T12 compression fracture with mild loss of height. No retropulsion of bone. A benign osteoporotic fracture is  favored over pathologic fracture (e.g. due to T12 metastasis in this setting).  2. Meet marrow edema in the lower lumbar spine  including at L5-S1 is favored to be degenerative in nature. No definite lumbar metastatic disease.  3. Chronically advanced lumbar spine degeneration, stable since 2011 except at L5-S1 where bilateral lateral recess stenosis appears progressed.   IMPRESSION/PLAN: 1. Generalized myasthenia gravis, AChR binding positive, thymoma negative (dx 03/2013)    He has remained stable prednisone 29m, so will continue to taper to 819mdaily  I will follow him close and going forward, if there is no worsening of MG, I will continue slow taper of prednisone.  On the other hand, if there is worsening of symptoms, steroid-sparing agent will be started  Continue mestinon 6041mID  Continue calcium intake of 1200 mg/day and vitamin D intake of 800 IU/day  Continue taking pepcid for GI prophylaxis  2.  Low back pain   - Due to T12 compression fracture, severe osteopenia  - Recommended using a cane or walker for gait assistance due to pain and instability 3. History of stroke with residual right  hemisensory loss and right leg weakness  - on ASA, statin, ACEi   - PT deferred by patient  - Strongly encouraged to use a cane; high a fall risk and patient has known osteopenia with already one vertebral fracture 4. History of left AION with residual visual field deficits 5. Carotid stenosis s/p L CEA (2008)  6. COPD 7. History of squamous cell carcinoma of the trachea, lung, bladder    I will see him back in 2-3 months, or sooner as needed   The duration of this appointment visit was 20 minutes of face-to-face time with the patient.  Greater than 50% of this time was spent in counseling, explanation of diagnosis, planning of further management, and coordination of care.   Thank you for allowing me to participate in patient's care.  If I can answer any additional questions, I would be pleased to do so.    Sincerely,    Chantal Worthey K. Posey Pronto, DO

## 2013-11-16 NOTE — Patient Instructions (Addendum)
1.  Reduce prednisone to 8mg  daily (8 tablets), if you develop worsening weakness, go back to taking 9mg  daily 2.  Continue mestinon 60mg  three times daily 3.  Encouraged to use a cane 4.  Return to [facility] in 12-months

## 2013-11-16 NOTE — Progress Notes (Signed)
Note faxed.

## 2013-12-25 ENCOUNTER — Encounter: Payer: Self-pay | Admitting: *Deleted

## 2014-02-19 ENCOUNTER — Ambulatory Visit (INDEPENDENT_AMBULATORY_CARE_PROVIDER_SITE_OTHER): Payer: PPO | Admitting: Neurology

## 2014-02-19 ENCOUNTER — Encounter: Payer: Self-pay | Admitting: Neurology

## 2014-02-19 VITALS — BP 180/80 | HR 58 | Ht 72.0 in | Wt 176.0 lb

## 2014-02-19 DIAGNOSIS — M545 Low back pain, unspecified: Secondary | ICD-10-CM

## 2014-02-19 DIAGNOSIS — IMO0001 Reserved for inherently not codable concepts without codable children: Secondary | ICD-10-CM

## 2014-02-19 DIAGNOSIS — R030 Elevated blood-pressure reading, without diagnosis of hypertension: Secondary | ICD-10-CM

## 2014-02-19 DIAGNOSIS — G7000 Myasthenia gravis without (acute) exacerbation: Secondary | ICD-10-CM

## 2014-02-19 DIAGNOSIS — Z79899 Other long term (current) drug therapy: Secondary | ICD-10-CM

## 2014-02-19 DIAGNOSIS — M5489 Other dorsalgia: Secondary | ICD-10-CM

## 2014-02-19 DIAGNOSIS — Z7189 Other specified counseling: Secondary | ICD-10-CM

## 2014-02-19 MED ORDER — PREDNISONE 1 MG PO TABS: ORAL_TABLET | ORAL | Status: DC

## 2014-02-19 NOTE — Addendum Note (Signed)
Addended by: Alda Berthold on: 02/19/2014 10:59 AM   Modules accepted: Orders

## 2014-02-19 NOTE — Patient Instructions (Addendum)
1.  Reduce prednisone to 4mg  daily   2.  Continue mestinon 60mg  three times daily 3.  Monitor blood pressure at home and share with your primary care doctor 4.  Start using a cane for your walking 5.  Information provided on Advanced Directive 6.  Return to clinic in 74-months

## 2014-02-19 NOTE — Progress Notes (Signed)
Note faxed.

## 2014-02-19 NOTE — Progress Notes (Signed)
Follow-up Visit   Date: 02/19/2014    Chase Nguyen MRN: [id number] DOB: 14-Dec-1935   Interim History: Chase Nguyen is a 79 y.o. 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  The patient was accompanied to the clinic by wife who also provides collateral information.    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 About two weeks later, he developed droopiness of his eyes. Symptoms are slightly better in the morning and worse by evening. He denies double vision, problems with talking, or shortness of breath. His wife says that he has always tended to keep his neck flexed for several years, but it has worsened over the past several weeks. He has occasional problems with swallowing.   He eventually saw his PCP who referred him to opthalmology where he saw Dr. Celesta Aver and recommended lab testing for myasthenia. His acetylcholine receptor binding antibody return positive (1.77, normal <0.24).  He was initially seen in the clinic on 03/13/2013 at which time mestinon 63m TID and prednisone 178mwas started.  - Follow-up 04/30/2013:  Significant improvement with ptosis and neck strength after taking mestinon. He is tolerating both prednisone and mestinon without any side effects.  He skipped his medications for the past two days, because he did not get it refilled.  No dysarthria, shortness of breath, or doubple vision.  Quit smoking in January!  - Follow-up 07/16/2013:  He denies any ongoing problems with ptosis.  About 2 weeks ago, he was helping his brother-in-law cut metal and  lifting things, but nothing heavier than usual, but since then he developed severe low back pain.  Pain is described as sharp knife-life in the low back region (central), without radiation into his leg.  He saw his PCP last week and was started on vicodin and zanaflex which does helps very slightly.   - Follow-up 10/17/2013: Overall, from myasthenia stand-point, he has been doing fairly well.  He recently was found to have T12 compression fracture and has severe osteoporosis.   - Follow-up 11/16/2013:   No new complaints with tapering his prednisone to 79m48m   UPDATE 02/19/2014:  He inadvertently reduced his prednisone to 5mg61mily instead of 8mg 64mly and has been doing well.  He denies any new weakness, double vision, or difficulty swallowing/talking.  No new complaints.  No interval falls.  His blood pressure is markedly elevated today, but he attributes this to not taking his morning medications.  He denies any chest pain, shortness of breath, or neurological symptoms.  Medications:  Current Outpatient Prescriptions on File Prior to Visit  Medication Sig Dispense Refill  . amLODipine (NORVASC) 10 MG tablet Take 10 mg by mouth daily.     . aspMarland Kitchenrin 325 MG tablet Take 325 mg by mouth daily.    . benazepril (LOTENSIN) 20 MG tablet Take 20 mg by mouth daily.      . Multiple Vitamins-Minerals (PRESERVISION AREDS PO) Take 2 tablets by mouth daily.    . pravastatin (PRAVACHOL) 40 MG tablet Take 40 mg by mouth daily.      . predniSONE (DELTASONE) 1 MG tablet  Take total of 66m daily. 150 tablet 3  . pyridostigmine (MESTINON) 60 MG tablet Take 1 tablet (60 mg total) by mouth 3 (three) times daily. 90 tablet 5  . tiZANidine (ZANAFLEX) 4 MG capsule Take 4 mg by mouth every 8 (eight) hours.    . vitamin B-12 (CYANOCOBALAMIN) 100 MCG tablet Take 100 mcg by mouth daily.     No current facility-administered medications on file prior to visit.    Allergies: None  Review of Systems:  CONSTITUTIONAL: No  fevers, chills, night sweats.  EYES: +visual changes or eye pain  ENT: +hearing changes. No history of nose bleeds.  RESPIRATORY: No cough, wheezing, shortness of breath.  CARDIOVASCULAR: Negative for chest pain, and palpitations.  GI: Negative for abdominal discomfort, blood in stools or black stools. No recent change in bowel habits.  GU: No history of incontinence.  MUSCLOSKELETAL: + history of joint pain or swelling. No myalgias.  SKIN: Negative for lesions, rash, and itching.  HEMATOLOGY/ONCOLOGY: Negative for prolonged bleeding, bruising easily, and swollen nodes. + history of cancer.  ENDOCRINE: No cold or heat intolerance, polydipsia or goiter.  PSYCH: No depression or anxiety symptoms.  NEURO: As Above.   Vital Signs:  BP 180/80 mmHg  Pulse 58  Ht 6' (1.829 m)  Wt 176 lb (79.833 kg)  BMI 23.86 kg/m2  SpO2 98%  Neurological Exam: MENTAL STATUS including orientation to time, place, person is normal.  Speech is mildly dysarthric (old).  CRANIAL NERVES:  Pupils equal round and reactive to light.  Normal conjugate, extra-ocular eye movements in all directions of gaze.  No ptosis with sustained upward gaze - able to look upward for >30 second).   Face is symmetric.  Frontalis, oribicularis oculi, orbicularis oris is 5/5. Buccinator is 5-/5 (stable).  Palate elevates symmetrically.  Tongue is midline with normal strength.  MOTOR: Neck flexion and neck extension is full strength and intact, although he maintains a slightly flexed posture at baseline. Motor strength of upper extremities is 5/5 bilaterally. There is very slight increase in tone in the right upper extremity. Residual weakness of the right lower extremity from old stroke, RLE is antigravity.  LLE is 5/5  COORDINATION/GAIT: Gait shows stooped posture, he is dragging the right leg.    Data: Labs 03/05/2013: AChR binding 1.77*, CRP 3.6, ESR 6  MRI lumbar spine wo contrast 07/17/2013: 1. Acute to subacute T12 compression  fracture with mild loss of height. No retropulsion of bone. A benign osteoporotic fracture is  favored over pathologic fracture (e.g. due to T12 metastasis in this setting).  2. Meet marrow edema in the lower lumbar spine including at L5-S1 is favored to be degenerative in nature. No definite lumbar metastatic disease.  3. Chronically advanced lumbar spine degeneration, stable since 2011 except at L5-S1 where bilateral lateral recess stenosis appears progressed.   IMPRESSION/PLAN: 1. Generalized myasthenia gravis, AChR binding positive, thymoma negative (dx 03/2013)    He has remained stable prednisone 532m(self-tapered), so will continue to taper to 67m57maily.  We discussed doing alternative day dosing, but he does prefers to do daily dosing for simplicity.  I encouraged him to get a pillbox  Continue mestinon 46m17mD  Continue calcium intake of 1200 mg/day and vitamin D intake of 800 IU/day  Continue taking pepcid for GI prophylaxis  2.  Elevated blood pressure  - patient reports not taking his morning medication.  He denies any chest pain, shortness of breath, or neurological complaints  - Encouraged him  take morning medication 3.  Low back pain   - Due to T12 compression fracture, severe osteopenia  - Stressed the importance of fall precautions since he continues to walk independently 4. History of stroke with residual right hemisensory loss and right leg weakness  - on ASA, statin, ACEi   - Strongly encouraged to use a cane; high a fall risk and patient has known osteopenia with already one vertebral fracture 5. History of left AION with residual visual field deficits 5. Carotid stenosis s/p L CEA (2008)  6. COPD 7. History of squamous cell carcinoma of the trachea, lung, bladder  8. Advanced directives discussed and information provided  I will see him back in 3 months, or sooner as needed   The duration of this appointment visit was 25 minutes of face-to-face time with the  patient.  Greater than 50% of this time was spent in counseling, explanation of diagnosis, planning of further management, and coordination of care.   Thank you for allowing me to participate in patient's care.  If I can answer any additional questions, I would be pleased to do so.    Sincerely,    Breuna Loveall K. Posey Pronto, DO

## 2014-05-21 ENCOUNTER — Encounter: Payer: Self-pay | Admitting: Neurology

## 2014-05-21 ENCOUNTER — Ambulatory Visit (INDEPENDENT_AMBULATORY_CARE_PROVIDER_SITE_OTHER): Payer: PPO | Admitting: Neurology

## 2014-05-21 VITALS — BP 158/70 | HR 65 | Ht 69.0 in | Wt 174.2 lb

## 2014-05-21 DIAGNOSIS — Z720 Tobacco use: Secondary | ICD-10-CM

## 2014-05-21 DIAGNOSIS — R269 Unspecified abnormalities of gait and mobility: Secondary | ICD-10-CM

## 2014-05-21 DIAGNOSIS — Z79899 Other long term (current) drug therapy: Secondary | ICD-10-CM

## 2014-05-21 DIAGNOSIS — G7000 Myasthenia gravis without (acute) exacerbation: Secondary | ICD-10-CM

## 2014-05-21 NOTE — Progress Notes (Signed)
Follow-up Visit   Date: 05/21/2014    [name] MRN: [id number] DOB: Jul 18, 1935   Interim History: Chase Nguyen is a 79 y.o. 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  The patient was accompanied to the clinic by wife who also provides collateral information.    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 About two weeks later, he developed droopiness of his eyes. Symptoms are slightly better in the morning and worse by evening. He denies double vision, problems with talking, or shortness of breath. His wife says that he has always tended to keep his neck flexed for several years, but it has worsened over the past several weeks. He has occasional problems with swallowing.   He eventually saw his PCP who referred him to opthalmology where he saw Dr. Celesta Aver and recommended lab testing for myasthenia. His acetylcholine receptor binding antibody return positive (1.77, normal <0.24).  He was initially seen in the clinic on 03/13/2013 at which time mestinon 42m TID and prednisone 160mwas started.  - Follow-up 04/30/2013:  Significant improvement with ptosis and neck strength after taking mestinon. He is tolerating both prednisone and mestinon without any side effects.  He skipped his medications for the past two days, because he did not get it refilled.  No dysarthria, shortness of breath, or doubple vision.  Quit smoking in January!  - Follow-up 07/16/2013:  He denies any ongoing problems with ptosis.  About 2 weeks ago, he was helping his brother-in-law cut metal and  lifting things, but nothing heavier than usual, but since then he developed severe low back pain.  Pain is described as sharp knife-life in the low back region (central), without radiation into his leg.  He saw his PCP last week and was started on vicodin and zanaflex which does helps very slightly.   - Follow-up 10/17/2013: Overall, from myasthenia stand-point, he has been doing fairly well.  He recently was found to have T12 compression fracture and has severe osteoporosis.   - Follow-up 11/16/2013:   No new complaints with tapering his prednisone to 42m1m   - Follow-up 02/19/2014:  He inadvertently reduced his prednisone to 5mg71mily instead of 8mg 742mly and has been doing well.  He denies any new weakness, double vision, or difficulty swallowing/talking.  No new complaints.  No interval falls.  His blood pressure is markedly elevated today, but he attributes this to not taking his morning medications.  He denies any chest pain, shortness of breath, or neurological symptoms.  UPDATE 05/21/2014:  He picked up smoking 1 ppd since January 2016 and had elevated blood pressure, so was started on a new medication.  No new complaints regarding his myasthenia gravis.  He was able to taper his prednisone to 4mg d37my without any difficulty. He continues to walk independently, even though he endorses using a cane makes him more stable.     Medications:  Current Outpatient Prescriptions on File Prior to Visit  Medication Sig Dispense Refill  . amLODipine (NORVASC) 10 MG tablet Take 10 mg by mouth daily.     . aspiMarland Kitchenin  325 MG tablet Take 325 mg by mouth daily.    . benazepril (LOTENSIN) 20 MG tablet Take 20 mg by mouth daily.      Marland Kitchen levothyroxine (SYNTHROID, LEVOTHROID) 100 MCG tablet     . Multiple Vitamins-Minerals (PRESERVISION AREDS PO) Take 2 tablets by mouth daily.    . pravastatin (PRAVACHOL) 40 MG tablet Take 40 mg by mouth daily.      . predniSONE (DELTASONE) 1 MG tablet Take total of 842m daily. 150  tablet 3  . pyridostigmine (MESTINON) 60 MG tablet Take 1 tablet (60 mg total) by mouth 3 (three) times daily. 90 tablet 5  . vitamin B-12 (CYANOCOBALAMIN) 100 MCG tablet Take 100 mcg by mouth daily.     No current facility-administered medications on file prior to visit.    Allergies: None  Review of Systems:  CONSTITUTIONAL: No fevers, chills, night sweats.  EYES: +visual changes or eye pain  ENT: +hearing changes. No history of nose bleeds.  RESPIRATORY: No cough, wheezing, shortness of breath.  CARDIOVASCULAR: Negative for chest pain, and palpitations.  GI: Negative for abdominal discomfort, blood in stools or black stools. No recent change in bowel habits.  GU: No history of incontinence.  MUSCLOSKELETAL: + history of joint pain or swelling. No myalgias.  SKIN: Negative for lesions, rash, and itching.  HEMATOLOGY/ONCOLOGY: Negative for prolonged bleeding, bruising easily, and swollen nodes. + history of cancer.  ENDOCRINE: No cold or heat intolerance, polydipsia or goiter.  PSYCH: No depression or anxiety symptoms.  NEURO: As Above.   Vital Signs:  BP 158/70 mmHg  Pulse 65  Ht 5' 9" (1.753 m)  Wt 174 lb 4 oz (79.039 kg)  BMI 25.72 kg/m2  SpO2 96%  Neurological Exam: MENTAL STATUS including orientation to time, place, person is normal.  Speech is mildly dysarthric (old).  CRANIAL NERVES:  Pupils equal round and reactive to light.  Normal conjugate, extra-ocular eye movements in all directions of gaze.  No ptosis with sustained upward gaze.   Face is symmetric.  Frontalis, oribicularis oculi, orbicularis oris is 5/5. Buccinator is 5-/5 (stable).  Palate elevates symmetrically.  Tongue is midline with normal strength.  MOTOR: Neck flexion and neck extension is full strength and intact, although he maintains a slightly flexed posture at baseline. Motor strength of upper extremities is 5/5 bilaterally. There is very slight increase in tone in the right upper extremity. Residual  weakness of the right lower extremity from old stroke, RLE is 4/5.  LLE is 5/5  COORDINATION/GAIT: Gait shows stooped posture, he is dragging the right leg.    Data: Labs 03/05/2013: AChR binding 1.77*, CRP 3.6, ESR 6  MRI lumbar spine wo contrast 07/17/2013: 1. Acute to subacute T12 compression fracture with mild loss of height. No retropulsion of bone. A benign osteoporotic fracture is  favored over pathologic fracture (e.g. due to T12 metastasis in this setting).  2. Meet marrow edema in the lower lumbar spine including at L5-S1 is favored to be degenerative in nature. No definite lumbar metastatic disease.  3. Chronically advanced lumbar spine degeneration, stable since 2011 except at L5-S1 where bilateral lateral recess stenosis appears progressed.   IMPRESSION/PLAN: 1. Generalized myasthenia gravis, AChR binding positive, thymoma negative (dx 03/2013)    He has remained stable prednisone 440m so will continue to taper to 42m92maily.  For simplicity, he prefers daily dose over alternate day dosing    Continue mestinon 75m44mD  Continue calcium intake of 1200 mg/day and vitamin D  intake of 800 IU/day  Continue taking pepcid for GI prophylaxis   Urged him to stay cool during the summer months to prevent worsening of MG symptoms  2.  Elevated blood pressure  - Most likely due to tobacco use which he restarted a few months ago  - Recently was started on benzapril 26m by PCP  3.  Low back pain   - Due to T12 compression fracture, severe osteopenia  - Long discussion on the need to use cane or walker  4.  Tobacco use, COPD  - Strongly encouraged to stop smoking, especially since he was able to quit for > 6 months  4. History of stroke with residual right hemisensory loss and right leg weakness  - on ASA, statin, ACEi   - Strongly encouraged to use a cane; high a fall risk and patient has known osteopenia with already one vertebral fracture  5. History of left AION with residual  visual field deficits  6. Carotid stenosis s/p L CEA (2008)   7. History of squamous cell carcinoma of the trachea, lung, bladder   Return to clinic in 2 months   The duration of this appointment visit was 25 minutes of face-to-face time with the patient.  Greater than 50% of this time was spent in counseling, explanation of diagnosis, planning of further management, and coordination of care.   Thank you for allowing me to participate in patient's care.  If I can answer any additional questions, I would be pleased to do so.    Sincerely,    Donika K. PPosey Pronto DO

## 2014-05-21 NOTE — Patient Instructions (Addendum)
1.  Start taking prednisone '3mg'$  daily (3 tablets) 2.  Continue taking pyridostigmine '60mg'$  three times daily 3.  Start using a cane or walker 4.  Try to cut down on tobacco use 5.  Return to clinic in 67-month

## 2014-07-15 ENCOUNTER — Encounter: Payer: Self-pay | Admitting: Family

## 2014-07-17 ENCOUNTER — Encounter: Payer: Self-pay | Admitting: Family

## 2014-07-17 ENCOUNTER — Ambulatory Visit (HOSPITAL_COMMUNITY)
Admission: RE | Admit: 2014-07-17 | Discharge: 2014-07-17 | Disposition: A | Discharge status: Home / Self Care | Payer: PPO | Source: Ambulatory Visit | Attending: Family | Admitting: Family

## 2014-07-17 ENCOUNTER — Ambulatory Visit (INDEPENDENT_AMBULATORY_CARE_PROVIDER_SITE_OTHER): Payer: PPO | Admitting: Family

## 2014-07-17 VITALS — BP 138/61 | HR 53 | Resp 16 | Ht 72.0 in | Wt 166.0 lb

## 2014-07-17 DIAGNOSIS — Z9889 Other specified postprocedural states: Secondary | ICD-10-CM

## 2014-07-17 DIAGNOSIS — Z8673 Personal history of transient ischemic attack (TIA), and cerebral infarction without residual deficits: Secondary | ICD-10-CM

## 2014-07-17 DIAGNOSIS — M545 Low back pain, unspecified: Secondary | ICD-10-CM | POA: Insufficient documentation

## 2014-07-17 DIAGNOSIS — F17200 Nicotine dependence, unspecified, uncomplicated: Secondary | ICD-10-CM

## 2014-07-17 DIAGNOSIS — Z720 Tobacco use: Secondary | ICD-10-CM | POA: Diagnosis not present

## 2014-07-17 DIAGNOSIS — I6523 Occlusion and stenosis of bilateral carotid arteries: Secondary | ICD-10-CM | POA: Insufficient documentation

## 2014-07-17 DIAGNOSIS — Z48812 Encounter for surgical aftercare following surgery on the circulatory system: Secondary | ICD-10-CM

## 2014-07-17 NOTE — Patient Instructions (Addendum)
Stroke Prevention Some medical conditions and behaviors are associated with an increased chance of having a stroke. You may prevent a stroke by making healthy choices and managing medical conditions. HOW CAN I REDUCE MY RISK OF HAVING A STROKE?   Stay physically active. Get at least 30 minutes of activity on most or all days.  Do not smoke. It may also be helpful to avoid exposure to secondhand smoke.  Limit alcohol use. Moderate alcohol use is considered to be:  No more than 2 drinks per day for men.  No more than 1 drink per day for nonpregnant women.  Eat healthy foods. This involves:  Eating 5 or more servings of fruits and vegetables a day.  Making dietary changes that address high blood pressure (hypertension), high cholesterol, diabetes, or obesity.  Manage your cholesterol levels.  Making food choices that are high in fiber and low in saturated fat, trans fat, and cholesterol may control cholesterol levels.  Take any prescribed medicines to control cholesterol as directed by your health care provider.  Manage your diabetes.  Controlling your carbohydrate and sugar intake is recommended to manage diabetes.  Take any prescribed medicines to control diabetes as directed by your health care provider.  Control your hypertension.  Making food choices that are low in salt (sodium), saturated fat, trans fat, and cholesterol is recommended to manage hypertension.  Take any prescribed medicines to control hypertension as directed by your health care provider.  Maintain a healthy weight.  Reducing calorie intake and making food choices that are low in sodium, saturated fat, trans fat, and cholesterol are recommended to manage weight.  Stop drug abuse.  Avoid taking birth control pills.  Talk to your health care provider about the risks of taking birth control pills if you are over 35 years old, smoke, get migraines, or have ever had a blood clot.  Get evaluated for sleep  disorders (sleep apnea).  Talk to your health care provider about getting a sleep evaluation if you snore a lot or have excessive sleepiness.  Take medicines only as directed by your health care provider.  For some people, aspirin or blood thinners (anticoagulants) are helpful in reducing the risk of forming abnormal blood clots that can lead to stroke. If you have the irregular heart rhythm of atrial fibrillation, you should be on a blood thinner unless there is a good reason you cannot take them.  Understand all your medicine instructions.  Make sure that other conditions (such as anemia or atherosclerosis) are addressed. SEEK IMMEDIATE MEDICAL CARE IF:   You have sudden weakness or numbness of the face, arm, or leg, especially on one side of the body.  Your face or eyelid droops to one side.  You have sudden confusion.  You have trouble speaking (aphasia) or understanding.  You have sudden trouble seeing in one or both eyes.  You have sudden trouble walking.  You have dizziness.  You have a loss of balance or coordination.  You have a sudden, severe headache with no known cause.  You have new chest pain or an irregular heartbeat. Any of these symptoms may represent a serious problem that is an emergency. Do not wait to see if the symptoms will go away. Get medical help at once. Call your local emergency services (911 in [place] Do not drive yourself to the hospital. Document Released: 02/26/2004 Document Revised: 06/04/2013 Document Reviewed: 07/21/2012 [employer] Patient Information 2015 [employer]. This information is not intended to replace advice given   to you by your health care provider. Make sure you discuss any questions you have with your health care provider.   1-800-QUITNOW is the help you stop smoking Quitline, it is free.

## 2014-07-17 NOTE — Progress Notes (Signed)
Established Carotid Patient   History of Present Illness  Chase Nguyen is a 79 y.o. male patient of Dr. Donnetta Hutching who is status post left CEA in October 2008. He has known occlusion of the right common and internal carotid arteries. He returns today for follow up. He had a stroke, as manifested by loss of vision in his left eye, left leg weakness, no left arm weakness, just before the 2008 left CEA. He continues to have loss of vision in his left eye, left leg weakness. He denies aphasia.  He has had no further known stroke or TIA symptoms.  He denies tingling, numbness, pain, or cold feeling in either upper extremity.  Pt denies claudication symptoms with walking, denies non healing wounds.  Pt reports New Medical or Surgical History: is taking prednisone for right ptosis He has  inflammation in his back.  He is active with gardening.  Pt Diabetic: No Pt smoker: resumed smoking from Feb, 2015 when he quit for 8 months  Pt meds include: Statin : Yes ASA: Yes Other anticoagulants/antiplatelets: no   Past Medical History  Diagnosis Date  . Left inguinal hernia   . Tobacco abuse   . Hx of bladder cancer   . Hyperlipemia   . HTN (hypertension)   . Bilateral cataracts   . Skin cancer     RIGHT EAR AND BACK  . Family hx of lung cancer   . Internal carotid artery occlusion   . Obstructive airway disease   . Degenerative arthritis of spine     AND HIPS  . 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  . Emphysema of lung   . Cancer of lower lobe of lung 04/05/2011  . COPD (chronic obstructive pulmonary disease)     Social History History  Substance Use Topics  . Smoking status: Current Every Day Smoker -- 1.00 packs/day for 71 years    Types: Cigarettes    Last Attempt to Quit: 03/04/2013  . Smokeless tobacco: Former Systems developer  . Alcohol Use: No    Family History Family History  Problem Relation Age  of Onset  . Heart attack Father   . Cancer Mother   . Cancer Sister   . Stroke Sister     Surgical History Past Surgical History  Procedure Laterality Date  . L vats  11/ 30/ 1999  . Left lower lobectomy  05/09/1998  . Tympanoplasty  07/11/1998    WITH OSSICULOPLASTY/LEFT  . Flexible bronchoscopy w/ laser  05/22/1998,07/06/1999,09/21/1999,04/07/2000  . Video bronchoscopy  11/02/1998  . Myringotomy  07/06/1999    with left tube placement  . Cystoscopy,transurethral resection of bladder  12/22/1999  . Fiberoptic bronch,bx of bronchial stem,bx of distal tracheal lesion,laser of distal tracheal lesion  06/02/1999  . Right canal wall down tympanoplasty/mastoidectomy,,myringotomy  08/23/2001  . Cataract extraction      Not on File  Current Outpatient Prescriptions  Medication Sig Dispense Refill  . amLODipine (NORVASC) 10 MG tablet Take 10 mg by mouth daily.     Marland Kitchen aspirin 325 MG tablet Take 325 mg by mouth daily.    . benazepril (LOTENSIN) 20 MG tablet Take 20 mg by mouth daily.      . Cholecalciferol (VITAMIN D-3 PO) Take by mouth.    . levothyroxine (SYNTHROID, LEVOTHROID) 100 MCG tablet     . Multiple Vitamins-Minerals (PRESERVISION AREDS PO) Take 2 tablets by mouth daily.    Marland Kitchen  pravastatin (PRAVACHOL) 40 MG tablet Take 40 mg by mouth daily.      . predniSONE (DELTASONE) 1 MG tablet Take total of '4mg'$  daily. 150 tablet 3  . pyridostigmine (MESTINON) 60 MG tablet Take 1 tablet (60 mg total) by mouth 3 (three) times daily. 90 tablet 5  . vitamin B-12 (CYANOCOBALAMIN) 100 MCG tablet Take 100 mcg by mouth daily.     No current facility-administered medications for this visit.    Review of Systems : See HPI for pertinent positives and negatives.  Physical Examination  Filed Vitals:   07/17/14 0944 07/17/14 0947  BP: 118/67 138/61  Pulse: 55 53  Resp:  16  Height:  6' (1.829 m)  Weight:  166 lb (75.297 kg)  SpO2:  98%   Body mass index is 22.51 kg/(m^2).   General: WDWN  male in NAD GAIT: slow and deliberate Eyes: PERRLA Pulmonary: Non-labored, CTAB, Negative Rales, Negative rhonchi, & Negative wheezing.  Cardiac: regular Rhythm, + murmur.  VASCULAR EXAM Carotid Bruits Left Right   positive Positive   Aorta is not palpable. Radial pulses: right is 2+, left is 1+ palpable.      Gastrointestinal: soft, nontender, BS WNL, no r/g, no palpable masses.  Musculoskeletal: Negative muscle atrophy/wasting. M/S 4/5 throughout, extremities without ischemic changes. trace pitting edema in right ankle.  Neurologic: A&O X 3; Appropriate Affect, Speech is normal CN 2-12 intact except is hard of hearing, Pain and light touch intact in extremities, Motor exam as listed above.         Non-Invasive Vascular Imaging CAROTID DUPLEX 07/17/2014   CEREBROVASCULAR DUPLEX EVALUATION    INDICATION: Carotid endarterectomy     PREVIOUS INTERVENTION(S): Left carotid endarterectomy on 11/28/06    DUPLEX EXAM:     RIGHT  LEFT  Peak Systolic Velocities (cm/s) End Diastolic Velocities (cm/s) Plaque LOCATION Peak Systolic Velocities (cm/s) End Diastolic Velocities (cm/s) Plaque    HT CCA PROXIMAL 155 8 HT    HT CCA MID 151 13 HT    HT CCA DISTAL 157 13      ECA 143 0     HT ICA PROXIMAL 132 20 HT    HT ICA MID 102 15     HT ICA DISTAL 85 12      ICA / CCA Ratio (PSV) Not Calculated  Antegrade Vertebral Flow Dampened/Antegrade  341 Brachial Systolic Pressure (mmHg) 962  Multiphasic (subclavian artery) Brachial Artery Waveforms Multiphasic (subclavian artery)    Plaque Morphology:  HM = Homogeneous, HT = Heterogeneous, CP = Calcific Plaque, SP = Smooth Plaque, IP = Irregular Plaque     ADDITIONAL FINDINGS: . The right carotid system was not fully evaluated due to known right common carotid and internal carotid artery occlusions on  previous exams. . No significant stenosis of the left external or common carotid arteries. . Limited visualization of the left vertebral artery suggests dampened, antegrade flow however this may be collateral flow. . Velocity of 318cm/s noted in the left proximal subclavian artery.    IMPRESSION: Patent left carotid endarterectomy site with Doppler velocities suggestive of 1-49% stenosis left internal carotid artery. Known right carotid artery occlusions on previous exams, as noted above.    Compared to the previous exam:  No significant change in the left internal carotid artery velocities when compared to the previous exam on 07/12/13.      Assessment: Chase Nguyen is a 79 y.o. male who is status post left CEA in October 2008. He has  known occlusion of the right common and internal carotid arteries. He had a stroke, as manifested by loss of vision in his left eye, left leg weakness, no left arm weakness, just before the 2008 left CEA. He continues to have loss of vision in his left eye, left leg weakness. He denies aphasia. He has had no further known stroke or TIA symptoms.   Today's carotid Duplex suggests a patent left carotid endarterectomy site with Doppler velocities suggestive of 1-49% stenosis left internal carotid artery.  Known right carotid artery occlusions on previous exams, as noted above. No significant change in the left internal carotid artery velocities when compared to the previous exam on 07/12/13.  Fortunately he does not have DM but unfortunately her resumed smoking recently after 8 months abstinence.     Plan:  The patient was counseled re smoking cessation and given several free resources re smoking cessation.  Follow-up in 1 year with Carotid Duplex.   I discussed in depth with the patient the nature of atherosclerosis, and emphasized the importance of maximal medical management including strict control of blood pressure, blood glucose, and lipid levels,  obtaining regular exercise, and cessation of smoking.  The patient is aware that without maximal medical management the underlying atherosclerotic disease process will progress, limiting the benefit of any interventions. The patient was given information about stroke prevention and what symptoms should prompt the patient to seek immediate medical care. Thank you for allowing [place] to participate in this patient's care.  Clemon Chambers, RN, MSN, FNP-C [facility] Office: [phone]  Clinic Physician: Scot Dock  07/17/2014 9:35 AM

## 2014-07-22 ENCOUNTER — Ambulatory Visit (INDEPENDENT_AMBULATORY_CARE_PROVIDER_SITE_OTHER): Payer: PPO | Admitting: Neurology

## 2014-07-22 ENCOUNTER — Encounter: Payer: Self-pay | Admitting: Neurology

## 2014-07-22 VITALS — BP 120/68 | HR 65 | Ht 72.0 in | Wt 168.6 lb

## 2014-07-22 DIAGNOSIS — R269 Unspecified abnormalities of gait and mobility: Secondary | ICD-10-CM | POA: Diagnosis not present

## 2014-07-22 DIAGNOSIS — G7000 Myasthenia gravis without (acute) exacerbation: Secondary | ICD-10-CM

## 2014-07-22 DIAGNOSIS — M545 Low back pain, unspecified: Secondary | ICD-10-CM

## 2014-07-22 DIAGNOSIS — Z79899 Other long term (current) drug therapy: Secondary | ICD-10-CM | POA: Diagnosis not present

## 2014-07-22 DIAGNOSIS — M5489 Other dorsalgia: Secondary | ICD-10-CM

## 2014-07-22 MED ORDER — PREDNISONE 1 MG PO TABS: ORAL_TABLET | ORAL | Status: DC

## 2014-07-22 NOTE — Patient Instructions (Addendum)
1.  Reduce prednisone as follows:  For the month of July:  '2mg'$  daily (2 tablets)   Starting in August: Reduce to '1mg'$  daily (1 tab) and continue this dose until I see you  2.  Call my office if you develop new weakness, changes in vision, swallowing, or talking 3.  Return to clinic in 2 months

## 2014-07-22 NOTE — Progress Notes (Signed)
Follow-up Visit   Date: 07/22/2014    Chase Nguyen MRN: [id number] DOB: 06-23-1935   Interim History: Chase Nguyen is a 79 y.o. 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 About two weeks later, he developed droopiness of his eyes. Symptoms are slightly better in the morning and worse by evening. He denies double vision, problems with talking, or shortness of breath. His wife says that he has always tended to keep his neck flexed for several years, but it has worsened over the past several weeks. He has occasional problems with swallowing.   He eventually saw his PCP who referred him to opthalmology where he saw Dr. Celesta Aver and recommended lab testing for myasthenia. His acetylcholine receptor binding antibody return positive (1.77, normal <0.24).  He was initially seen in the clinic on 03/13/2013 at which time mestinon 81m TID and prednisone 127mwas started.  - Follow-up 04/30/2013:  Significant improvement with ptosis and neck strength after taking mestinon. He is tolerating both prednisone and mestinon without any side effects.  He skipped his medications for the past two days, because he did not get it refilled.  No dysarthria, shortness of breath, or doubple vision.  Quit smoking in January.  - Follow-up 07/16/2013:  He denies any ongoing problems with ptosis.  About 2 weeks ago, he was helping his brother-in-law cut metal and lifting things, but nothing heavier than usual, but since then he developed severe low back  pain.  Pain is described as sharp knife-life in the low back region (central), without radiation into his leg.  He saw his PCP last week and was started on vicodin and zanaflex which does helps very slightly.   - Follow-up 10/17/2013: Overall, from myasthenia stand-point, he has been doing fairly well.  He recently was found to have T12 compression fracture and has severe osteoporosis.   - Follow-up 11/16/2013:   No new complaints with tapering his prednisone to 12m63m   - Follow-up 02/19/2014:  He inadvertently reduced his prednisone to 5mg80mily instead of 8mg 92mly and has been doing well.  He denies any new weakness, double vision, or difficulty swallowing/talking.  No new complaints.  No interval falls.  His blood pressure is markedly elevated today, but he attributes this to not taking his morning medications.  He denies any chest pain, shortness of breath, or neurological symptoms.  UPDATE 05/21/2014:  He picked up smoking 1 ppd since January 2016 and had elevated blood pressure, so was started on a new medication. He was able to taper his prednisone to 4mg d64my without any difficulty.   UPDATE 07/22/2014:  He was able to taper prednisone to 3mg da34m = 21mg/we65mithout any worsening of symptoms.  We have discussed doing alternate day dosing of medications, but he manages his own medications and prefers daily dosing.  He has no new complaints today.    Medications:  Current Outpatient Prescriptions on File Prior to Visit  Medication Sig Dispense Refill  . amLODipine (NORVASC) 10 MG tablet Take 10 mg by mouth daily.     .Marland Kitchen  aspirin 325 MG tablet Take 325 mg by mouth daily.    . benazepril (LOTENSIN) 20 MG tablet Take 20 mg by mouth daily.      . Cholecalciferol (VITAMIN D-3 PO) Take by mouth.    . levothyroxine (SYNTHROID, LEVOTHROID) 100 MCG tablet     . Multiple Vitamins-Minerals (PRESERVISION AREDS PO) Take 2 tablets by mouth daily.    . pravastatin (PRAVACHOL) 40 MG tablet Take 40 mg by  mouth daily.      . predniSONE (DELTASONE) 1 MG tablet Take total of 47m daily. 150 tablet 3  . pyridostigmine (MESTINON) 60 MG tablet Take 1 tablet (60 mg total) by mouth 3 (three) times daily. 90 tablet 5  . vitamin B-12 (CYANOCOBALAMIN) 100 MCG tablet Take 100 mcg by mouth daily.     No current facility-administered medications on file prior to visit.    Allergies: None  Review of Systems:  CONSTITUTIONAL: No fevers, chills, night sweats.  EYES: +visual changes or eye pain  ENT: +hearing changes. No history of nose bleeds.  RESPIRATORY: No cough, wheezing, shortness of breath.  CARDIOVASCULAR: Negative for chest pain, and palpitations.  GI: Negative for abdominal discomfort, blood in stools or black stools. No recent change in bowel habits.  GU: No history of incontinence.  MUSCLOSKELETAL: + history of joint pain or swelling. No myalgias.  SKIN: Negative for lesions, rash, and itching.  HEMATOLOGY/ONCOLOGY: Negative for prolonged bleeding, bruising easily, and swollen nodes. + history of cancer.  ENDOCRINE: No cold or heat intolerance, polydipsia or goiter.  PSYCH: No depression or anxiety symptoms.  NEURO: As Above.   Vital Signs:  BP 120/68 mmHg  Pulse 65  Ht 6' (1.829 m)  Wt 168 lb 9 oz (76.459 kg)  BMI 22.86 kg/m2  SpO2 97%  Neurological Exam: MENTAL STATUS including orientation to time, place, person is normal.  Speech is mildly dysarthric (old).  CRANIAL NERVES:  Pupils equal round and reactive to light.  Normal conjugate, extra-ocular eye movements in all directions of gaze.  No ptosis with sustained upward gaze.   Face is symmetric.  Frontalis, oribicularis oculi, orbicularis oris is 5/5. Buccinator is 5-/5 (stable).  Palate elevates symmetrically.  Tongue is midline with normal strength.  MOTOR:  Motor strength of upper extremities is 5/5 bilaterally. There is very slight increase in tone in the right upper extremity. Residual weakness of the right lower extremity  from old stroke, RLE is 4+/5.  LLE is 5/5  COORDINATION/GAIT: Gait shows stooped posture, he is dragging the right leg.    Data: Labs 03/05/2013: AChR binding 1.77*, CRP 3.6, ESR 6  MRI lumbar spine wo contrast 07/17/2013: 1. Acute to subacute T12 compression fracture with mild loss of height. No retropulsion of bone. A benign osteoporotic fracture is  favored over pathologic fracture (e.g. due to T12 metastasis in this setting).  2. Meet marrow edema in the lower lumbar spine including at L5-S1 is favored to be degenerative in nature. No definite lumbar metastatic disease.  3. Chronically advanced lumbar spine degeneration, stable since 2011 except at L5-S1 where bilateral lateral recess stenosis appears progressed.   IMPRESSION/PLAN: 1. Generalized myasthenia gravis, AChR binding positive, thymoma negative (dx 03/2013)    He has remained stable prednisone 3436m so will continue to taper to 36m96maily x 1 month, then reduce to 1mg36mily.  For simplicity, he prefers daily dose over alternate day dosing    Continue mestinon 60mg29m  Continue calcium intake of 1200 mg/day and vitamin  D intake of 800 IU/day  Continue taking pepcid for GI prophylaxis   3.  Low back pain   - Due to T12 compression fracture, severe osteopenia  - Long discussion on the need to use cane or walker  4.  Tobacco use, COPD  - Strongly encouraged to stop smoking, but he is not interested in quitting  4. History of stroke with residual right hemisensory loss and right leg weakness  - on ASA, statin, ACEi   - Strongly encouraged to use a cane; high a fall risk and patient has known osteopenia with already one vertebral fracture  5. History of left AION with residual visual field deficits  6. Carotid stenosis s/p L CEA (2008)   7. History of squamous cell carcinoma of the trachea, lung, bladder   Return to clinic in 2 months   The duration of this appointment visit was 25 minutes of face-to-face time with  the patient.  Greater than 50% of this time was spent in counseling, explanation of diagnosis, planning of further management, and coordination of care.   Thank you for allowing me to participate in patient's care.  If I can answer any additional questions, I would be pleased to do so.    Sincerely,    Donika K. Posey Pronto, DO

## 2014-07-23 ENCOUNTER — Ambulatory Visit: Payer: PPO | Admitting: Neurology

## 2014-09-06 ENCOUNTER — Other Ambulatory Visit: Payer: Self-pay | Admitting: *Deleted

## 2014-09-06 MED ORDER — PYRIDOSTIGMINE BROMIDE 60 MG PO TABS: 60.0000 mg | ORAL_TABLET | Freq: Three times a day (TID) | ORAL | Status: DC

## 2014-10-02 ENCOUNTER — Encounter: Payer: Self-pay | Admitting: Neurology

## 2014-10-02 ENCOUNTER — Ambulatory Visit (INDEPENDENT_AMBULATORY_CARE_PROVIDER_SITE_OTHER): Payer: PPO | Admitting: Neurology

## 2014-10-02 ENCOUNTER — Telehealth: Payer: Self-pay | Admitting: Neurology

## 2014-10-02 VITALS — BP 120/58 | HR 62 | Ht 72.0 in | Wt 160.2 lb

## 2014-10-02 DIAGNOSIS — R269 Unspecified abnormalities of gait and mobility: Secondary | ICD-10-CM

## 2014-10-02 DIAGNOSIS — G7000 Myasthenia gravis without (acute) exacerbation: Secondary | ICD-10-CM | POA: Diagnosis not present

## 2014-10-02 DIAGNOSIS — G8311 Monoplegia of lower limb affecting right dominant side: Secondary | ICD-10-CM

## 2014-10-02 DIAGNOSIS — Z720 Tobacco use: Secondary | ICD-10-CM | POA: Diagnosis not present

## 2014-10-02 DIAGNOSIS — M545 Low back pain, unspecified: Secondary | ICD-10-CM

## 2014-10-02 MED ORDER — PREDNISONE 1 MG PO TABS: ORAL_TABLET | ORAL | Status: DC

## 2014-10-02 NOTE — Progress Notes (Signed)
Note routed

## 2014-10-02 NOTE — Telephone Encounter (Signed)
Cheryl/ from; "[facility]" [phone] stated that/[facility] will take the/ Health team insurance instead of them

## 2014-10-02 NOTE — Progress Notes (Signed)
Follow-up Visit   Date: 10/02/2014    Chase Nguyen MRN: [id number] DOB: July 02, 1935   Interim History: Chase Nguyen is a 79 y.o. 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 About two weeks later, he developed droopiness of his eyes. Symptoms are slightly better in the morning and worse by evening. He denies double vision, problems with talking, or shortness of breath. His wife says that he has always tended to keep his neck flexed for several years, but it has worsened over the past several weeks. He has occasional problems with swallowing.   He eventually saw his PCP who referred him to opthalmology where he saw Dr. Celesta Aver and recommended lab testing for myasthenia. His acetylcholine receptor binding antibody return positive (1.77, normal <0.24).  He was initially seen in the clinic on 03/13/2013 at which time mestinon 1m TID and prednisone 150mwas started.  - Follow-up 04/30/2013:  Significant improvement with ptosis and neck strength after taking mestinon. He is tolerating both prednisone and mestinon without any side effects.  He skipped his medications for the past two days, because he did not get it refilled.    - Follow-up 07/16/2013:  He denies any ongoing problems with ptosis.  About 2 weeks ago, he was helping his brother-in-law cut metal and lifting things, but nothing heavier than usual, but since then he developed severe low back pain.  Pain is described as sharp knife-life in the low back region (central),  without radiation into his leg.  - Follow-up 11/16/2013:   No new complaints with tapering his prednisone to 67m54m   - Follow-up 02/19/2014:  He inadvertently reduced his prednisone to 5mg75mily instead of 8mg 45mly and has been doing well.  He denies any new weakness, double vision, or difficulty swallowing/talking.    UPDATE 05/21/2014:  He picked up smoking 1 ppd since January 2016 and had elevated blood pressure, so was started on a new medication. He was able to taper his prednisone to 4mg d96my without any difficulty.   UPDATE 07/22/2014:  He was able to taper prednisone to 3mg da12m = 21mg/we34mithout any worsening of symptoms.  .   UPDAMarland KitchenE 10/02/2014:  He has been on prednisone 1mg dail51mor the past month with no new MG symptoms.  His right leg seems weaker and his gait is worse, but he does not use a gait assistive device, despite my recommendations.     Medications:  Current Outpatient Prescriptions on File Prior to Visit  Medication Sig Dispense Refill  . amLODipine (NORVASC) 10 MG tablet Take 10 mg by mouth daily.     . aspirinMarland Kitchen325 MG tablet Take 325 mg by mouth daily.    . Cholecalciferol (VITAMIN D-3 PO) Take by mouth.    . Multiple Vitamins-Minerals (PRESERVISION AREDS PO) Take 2 tablets by mouth daily.    . pravastatin (PRAVACHOL) 40 MG tablet Take 40 mg by mouth daily.      . predniSONE (DELTASONE) 1 MG tablet Take total of 3mg daily36m1 month, then  4m daily 150 tablet 3  . pyridostigmine (MESTINON) 60 MG tablet Take 1 tablet (60 mg total) by mouth 3 (three) times daily. 90 tablet 5  . vitamin B-12 (CYANOCOBALAMIN) 100 MCG tablet Take 100 mcg by mouth daily.     No current facility-administered medications on file prior to visit.    Allergies: None  Review of Systems:  CONSTITUTIONAL: No fevers, chills, night sweats.  EYES: +visual changes or eye pain  ENT: +hearing changes. No history of nose bleeds.  RESPIRATORY: No cough, wheezing, shortness of breath.    CARDIOVASCULAR: Negative for chest pain, and palpitations.  GI: Negative for abdominal discomfort, blood in stools or black stools. No recent change in bowel habits.  GU: No history of incontinence.  MUSCLOSKELETAL: + history of joint pain or swelling. No myalgias.  SKIN: Negative for lesions, rash, and itching.  HEMATOLOGY/ONCOLOGY: Negative for prolonged bleeding, bruising easily, and swollen nodes. + history of cancer.  ENDOCRINE: No cold or heat intolerance, polydipsia or goiter.  PSYCH: No depression or anxiety symptoms.  NEURO: As Above.   Vital Signs:  BP 120/58 mmHg  Pulse 62  Ht 6' (1.829 m)  Wt 160 lb 3 oz (72.661 kg)  BMI 21.72 kg/m2  SpO2 96%  Neurological Exam: MENTAL STATUS including orientation to time, place, person is normal.  Speech is mildly dysarthric (old).  CRANIAL NERVES:  Pupils equal round and reactive to light.  Normal conjugate, extra-ocular eye movements in all directions of gaze.  No ptosis with sustained upward gaze.   Face is symmetric.  Frontalis, oribicularis oculi, orbicularis oris is 5/5. Buccinator is 5-/5 (stable).  Palate elevates symmetrically.  Tongue is midline with normal strength.  MOTOR:  Motor strength of upper extremities is 5/5 bilaterally. There is very slight increase in tone in the right upper extremity. Residual weakness of the right lower extremity from old stroke, RLE is 3+/5 (worse).  LLE is 5/5  COORDINATION/GAIT: Gait shows stooped posture, unsteady, he is dragging the right leg.    Data: Labs 03/05/2013: AChR binding 1.77*, CRP 3.6, ESR 6  MRI lumbar spine wo contrast 07/17/2013: 1. Acute to subacute T12 compression fracture with mild loss of height. No retropulsion of bone. A benign osteoporotic fracture is  favored over pathologic fracture (e.g. due to T12 metastasis in this setting).  2. Meet marrow edema in the lower lumbar spine including at L5-S1 is favored to be degenerative in nature. No definite lumbar metastatic  disease.  3. Chronically advanced lumbar spine degeneration, stable since 2011 except at L5-S1 where bilateral lateral recess stenosis appears progressed.   IMPRESSION/PLAN: 1. Generalized myasthenia gravis, AChR binding positive, thymoma negative (dx 03/2013)    He has remained stable prednisone 145mdaily, so will taper further to 41m46mWF for one month, then reduce to 41mg18mnday and Thursday    Continue mestinon 60mg25m   Continue calcium intake of 1200 mg/day and vitamin D intake of 800 IU/day  2.  Chronic low back pain   - Due to T12 compression fracture, severe osteopenia  - Strongly encouraged use cane or walker  - Start home PT  3.  Tobacco use, COPD  - Again, I encouraged to stop smoking, but he is not interested in quitting  4. History of stroke with residual right hemisensory loss and right leg weakness  - on ASA, statin, ACEi   - Strongly encouraged to use a cane; high a fall risk and patient has known osteopenia with already one vertebral  fracture  5. History of left AION with residual visual field deficits  6. Carotid stenosis s/p L CEA (2008)   7. History of squamous cell carcinoma of the trachea, lung, bladder   Return to clinic in 2 months   The duration of this appointment visit was 25 minutes of face-to-face time with the patient.  Greater than 50% of this time was spent in counseling, explanation of diagnosis, planning of further management, and coordination of care.   Thank you for allowing me to participate in patient's care.  If I can answer any additional questions, I would be pleased to do so.    Sincerely,    Donika K. Posey Pronto, DO

## 2014-10-02 NOTE — Patient Instructions (Addendum)
1.  Reduce prednisone to '1mg'$  Monday, Wednesday, and Friday for 1 month. 2.  Starting in October, reduce further to prednisone 1 mg on Monday and Thursday 3.  Home PT for back strengthening and right leg weakness 4.  Return to clinic in the end of October

## 2014-10-03 NOTE — Telephone Encounter (Signed)
Referral sent to [facility].  Fax: [phone]

## 2014-10-11 ENCOUNTER — Other Ambulatory Visit: Payer: Self-pay | Admitting: *Deleted

## 2014-10-11 MED ORDER — PYRIDOSTIGMINE BROMIDE 60 MG PO TABS: 60.0000 mg | ORAL_TABLET | Freq: Three times a day (TID) | ORAL | Status: DC

## 2014-10-31 ENCOUNTER — Other Ambulatory Visit: Payer: Self-pay | Admitting: Internal Medicine

## 2014-10-31 DIAGNOSIS — R911 Solitary pulmonary nodule: Secondary | ICD-10-CM

## 2014-11-06 ENCOUNTER — Other Ambulatory Visit: Payer: PPO

## 2014-11-18 ENCOUNTER — Ambulatory Visit (INDEPENDENT_AMBULATORY_CARE_PROVIDER_SITE_OTHER): Payer: PPO | Admitting: Neurology

## 2014-11-18 ENCOUNTER — Encounter: Payer: Self-pay | Admitting: Neurology

## 2014-11-18 VITALS — BP 150/88 | HR 102 | Wt 164.1 lb

## 2014-11-18 DIAGNOSIS — G7000 Myasthenia gravis without (acute) exacerbation: Secondary | ICD-10-CM | POA: Diagnosis not present

## 2014-11-18 DIAGNOSIS — Z720 Tobacco use: Secondary | ICD-10-CM

## 2014-11-18 DIAGNOSIS — R269 Unspecified abnormalities of gait and mobility: Secondary | ICD-10-CM | POA: Diagnosis not present

## 2014-11-18 NOTE — Patient Instructions (Addendum)
1.  Take prednisone '1mg'$  tab today and next Monday, then STOP. 2.  Continue mestinon '60mg'$  three times daily  3.  Continue home exercises.   4.  Return to clinic in 3 months

## 2014-11-18 NOTE — Progress Notes (Signed)
Follow-up Visit   Date: 11/18/2014    Chase Nguyen MRN: [id number] DOB: September 07, 1935   Interim History: Chase Nguyen is a 79 y.o. 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 About two weeks later, he developed droopiness of his eyes. Symptoms are slightly better in the morning and worse by evening. He denies double vision, problems with talking, or shortness of breath. His wife says that he has always tended to keep his neck flexed for several years, but it has worsened over the past several weeks. He has occasional problems with swallowing.   He eventually saw his PCP who referred him to opthalmology where he saw Dr. Celesta Aver and recommended lab testing for myasthenia. His acetylcholine receptor binding antibody return positive (1.77, normal <0.24).  He was initially seen in the clinic on 03/13/2013 at which time mestinon 13m TID and prednisone 165mwas started.  - Follow-up 04/30/2013:  Significant improvement with ptosis and neck strength after taking mestinon. He is tolerating both prednisone and mestinon without any side effects.  He skipped his medications for the past two days, because he did not get it refilled.    - Follow-up 07/16/2013:  He denies any ongoing problems with ptosis.  About 2 weeks ago, he was helping his brother-in-law cut metal and lifting things, but nothing heavier than usual, but since then he developed severe low back pain.  Pain is described as sharp knife-life in the low back region (central),  without radiation into his leg.  - Follow-up 11/16/2013:   No new complaints with tapering his prednisone to 31m31m   - Follow-up 02/19/2014:  He inadvertently reduced his prednisone to 5mg37mily instead of 8mg 66mly and has been doing well.  He denies any new weakness, double vision, or difficulty swallowing/talking.    UPDATE 05/21/2014:  He picked up smoking 1 ppd since January 2016 and had elevated blood pressure, so was started on a new medication. He was able to taper his prednisone to 4mg d46my without any difficulty.   UPDATE 07/22/2014:  He was able to taper prednisone to 3mg da52m = 21mg/we34mithout any worsening of symptoms.  .   UPDAMarland KitchenE 10/02/2014:  He has been on prednisone 1mg dail31mor the past month with no new MG symptoms.  His right leg seems weaker and his gait is worse, but he does not use a gait assistive device, despite my recommendations.    UPDATE 11/18/2014:  He has remained stable tapering his prednisone down from 1mg three12mmes per week, to twice per week.  His wife feels that PT really helped, but he did not continue his home exercise. He was recommended to use a cane, but he has not been using it.  No interval falls.     Medications:  Current Outpatient Prescriptions on File Prior to Visit  Medication Sig Dispense Refill  . amLODipine (NORVASC) 10 MG tablet Take 10 mg by mouth daily.     . aspirin Marland Kitchen25 MG tablet Take 325 mg by mouth daily.    .Marland Kitchen  benazepril-hydrochlorthiazide (LOTENSIN HCT) 20-12.5 MG per tablet Take 1 tablet by mouth daily.    . Cholecalciferol (VITAMIN D-3 PO) Take by mouth.    . levothyroxine (SYNTHROID, LEVOTHROID) 112 MCG tablet Take 112 mcg by mouth daily before breakfast.    . Multiple Vitamins-Minerals (PRESERVISION AREDS PO) Take 2 tablets by mouth daily.    . pravastatin (PRAVACHOL) 40 MG tablet Take 40 mg by mouth daily.      . predniSONE (DELTASONE) 1 MG tablet Take 19m MWF for a month, then reduce to 129mMonday and Thursday. 150 tablet 3    . pyridostigmine (MESTINON) 60 MG tablet Take 1 tablet (60 mg total) by mouth 3 (three) times daily. 90 tablet 5  . vitamin B-12 (CYANOCOBALAMIN) 100 MCG tablet Take 100 mcg by mouth daily.     No current facility-administered medications on file prior to visit.    Allergies: None  Review of Systems:  CONSTITUTIONAL: No fevers, chills, night sweats.  EYES: +visual changes or eye pain  ENT: +hearing changes. No history of nose bleeds.  RESPIRATORY: No cough, wheezing, shortness of breath.  CARDIOVASCULAR: Negative for chest pain, and palpitations.  GI: Negative for abdominal discomfort, blood in stools or black stools. No recent change in bowel habits.  GU: No history of incontinence.  MUSCLOSKELETAL: + history of joint pain or swelling. No myalgias.  SKIN: Negative for lesions, rash, and itching.  HEMATOLOGY/ONCOLOGY: Negative for prolonged bleeding, bruising easily, and swollen nodes. + history of cancer.  ENDOCRINE: No cold or heat intolerance, polydipsia or goiter.  PSYCH: No depression or anxiety symptoms.  NEURO: As Above.   Vital Signs:  BP 150/88 mmHg  Pulse 102  Wt 164 lb 1 oz (74.418 kg)  SpO2 90%  Neurological Exam: MENTAL STATUS including orientation to time, place, person is normal.  Speech is mildly dysarthric (old).  CRANIAL NERVES:  Pupils equal round and reactive to light.  Normal conjugate, extra-ocular eye movements in all directions of gaze.  No ptosis with sustained upward gaze.   Face is symmetric.  Frontalis, oribicularis oculi, orbicularis oris is 5/5. Buccinator is 5-/5 (stable).  Palate elevates symmetrically.  Tongue is midline with normal strength.  MOTOR:  Motor strength of upper extremities is 5/5 bilaterally. There is very slight increase in tone in the right upper extremity. Residual weakness of the right lower extremity from old stroke, RLE is 4-/5 (improved).  LLE is 5/5  COORDINATION/GAIT: Gait shows stooped posture, he is dragging the right  leg, stable as compared to last time.     Data: Labs 03/05/2013: AChR binding 1.77*, CRP 3.6, ESR 6  MRI lumbar spine wo contrast 07/17/2013: 1. Acute to subacute T12 compression fracture with mild loss of height. No retropulsion of bone. A benign osteoporotic fracture is  favored over pathologic fracture (e.g. due to T12 metastasis in this setting).  2. Meet marrow edema in the lower lumbar spine including at L5-S1 is favored to be degenerative in nature. No definite lumbar metastatic disease.  3. Chronically advanced lumbar spine degeneration, stable since 2011 except at L5-S1 where bilateral lateral recess stenosis appears progressed.   IMPRESSION/PLAN: 1. Generalized myasthenia gravis, AChR binding positive, thymoma negative (dx 03/2013)    He has remained stable tapering the prednisone and is now on 47m88mwice per week.  Continue prednisone 47mg347mekly x 2 weeks, then stop.  If there is relapse, start prednisone 10mg747m did not require greater than 10mg 17miously)  Continue mestinon 60mg T47m  2. Due to T12 compression fracture, severe osteopenia  - Continues to walk independently, despite my strong recommendation to use cane or walker  - Noncompliant with home exercises  3.  Tobacco use, COPD.   - not interested in quitting  4. History of stroke with residual right hemisensory loss and right leg weakness  - on ASA, statin, ACEi   - Strongly encouraged to use a cane; high a fall risk and patient has known osteopenia with already one vertebral fracture  5. History of left AION with residual visual field deficits  6. Carotid stenosis s/p L CEA (2008)   7. History of squamous cell carcinoma of the trachea, lung, bladder   Return to clinic in 3 months   The duration of this appointment visit was 20 minutes of face-to-face time with the patient.  Greater than 50% of this time was spent in counseling, explanation of diagnosis, planning of further management, and coordination of  care.   Thank you for allowing me to participate in patient's care.  If I can answer any additional questions, I would be pleased to do so.    Sincerely,    Donika K. Posey Pronto, DO

## 2014-11-18 NOTE — Progress Notes (Signed)
Note routed

## 2015-03-05 ENCOUNTER — Encounter: Payer: Self-pay | Admitting: Neurology

## 2015-03-05 ENCOUNTER — Ambulatory Visit (INDEPENDENT_AMBULATORY_CARE_PROVIDER_SITE_OTHER): Payer: PPO | Admitting: Neurology

## 2015-03-05 VITALS — BP 130/70 | HR 56 | Wt 172.1 lb

## 2015-03-05 DIAGNOSIS — G7000 Myasthenia gravis without (acute) exacerbation: Secondary | ICD-10-CM | POA: Diagnosis not present

## 2015-03-05 DIAGNOSIS — Z720 Tobacco use: Secondary | ICD-10-CM | POA: Diagnosis not present

## 2015-03-05 NOTE — Patient Instructions (Signed)
1.  Please restart home exercises to strength your right leg 2.  Encouraged to reduce smoking 3.  If you develop any new weakness, double vision, droopy eyes, or difficulty with swallowing/talking call my office. 4.  Continue mestinon '60mg'$  three times daily  Return to clinic in 4 months

## 2015-03-05 NOTE — Progress Notes (Signed)
Note routed

## 2015-03-05 NOTE — Progress Notes (Signed)
Follow-up Visit   Date: 03/05/2015    Chase Nguyen MRN: [id number] DOB: 1935/11/08   Interim History: Chase Nguyen is a 80 y.o. 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 About two weeks later, he developed droopiness of his eyes. Symptoms are slightly better in the morning and worse by evening. He denies double vision, problems with talking, or shortness of breath. His wife says that he has always tended to keep his neck flexed for several years, but it has worsened over the past several weeks. He has occasional problems with swallowing.   He eventually saw his PCP who referred him to opthalmology where he saw Dr. Celesta Aver and recommended lab testing for myasthenia. His acetylcholine receptor binding antibody return positive (1.77, normal <0.24).  He was initially seen in the clinic on 03/13/2013 at which time mestinon 27m TID and prednisone 163mwas started.  He had significant improvement with ptosis and neck strength after taking mestinon. He is tolerating both prednisone and mestinon without any side effects.    - Follow-up 07/16/2013:  He denies any ongoing problems with ptosis.  About 2 weeks ago, he was helping his brother-in-law cut metal and lifting things, but nothing heavier than usual, but since then he developed severe low back pain.  Pain is described as sharp knife-life in the low back region (central), without radiation into his leg.  He was found to have vertebral compression fracture at T12.     -  Follow-up 02/19/2014:  He inadvertently reduced his prednisone to 74m79maily instead of 8mg974mily and has been doing well.  He denies any new weakness, double vision, or difficulty swallowing/talking.    UPDATE 05/21/2014:  He picked up smoking 1 ppd since January 2016 and had elevated blood pressure, so was started on a new medication. He was able to taper his prednisone to 4mg 52mly without any difficulty. .   UMarland KitchenDATE 10/02/2014:  He has been on prednisone 1mg d104my for the past month with no new MG symptoms.  His right leg seems weaker and his gait is worse, but he does not use a gait assistive device, despite my recommendations.    UPDATE 11/18/2014:  He has remained stable tapering his prednisone down from 1mg th28m times per week, to twice per week.  His wife feels that PT really helped, but he did not continue his home exercise. He was recommended to use a cane, but he has not been using it.   UPDATE 03/05/2015:  He was able to taper off prednisone in December without any relapse.  No new complaints.  No interval falls, but endorses imbalance and weakness of the right leg (old stroke), admits to noncompliance with home exercises.    Medications:  Current Outpatient Prescriptions on File Prior to Visit  Medication Sig Dispense Refill  . amLODipine (NORVASC) 10 MG tablet Take 10 mg by mouth daily.     . aspirMarland Kitchenn 325 MG tablet Take 325 mg by mouth daily.    . benazepril-hydrochlorthiazide (LOTENSIN HCT) 20-12.5 MG per  tablet Take 1 tablet by mouth daily.    . Cholecalciferol (VITAMIN D-3 PO) Take by mouth.    . levothyroxine (SYNTHROID, LEVOTHROID) 112 MCG tablet Take 112 mcg by mouth daily before breakfast.    . Multiple Vitamins-Minerals (PRESERVISION AREDS PO) Take 2 tablets by mouth daily.    . pravastatin (PRAVACHOL) 40 MG tablet Take 40 mg by mouth daily.      Marland Kitchen pyridostigmine (MESTINON) 60 MG tablet Take 1 tablet (60 mg total) by mouth 3 (three) times daily. 90 tablet 5  . vitamin B-12  (CYANOCOBALAMIN) 100 MCG tablet Take 100 mcg by mouth daily.     No current facility-administered medications on file prior to visit.    Allergies: None  Review of Systems:  CONSTITUTIONAL: No fevers, chills, night sweats.  EYES: +visual changes or eye pain  ENT: +hearing changes. No history of nose bleeds.  RESPIRATORY: No cough, wheezing, shortness of breath.  CARDIOVASCULAR: Negative for chest pain, and palpitations.  GI: Negative for abdominal discomfort, blood in stools or black stools. No recent change in bowel habits.  GU: No history of incontinence.  MUSCLOSKELETAL: + history of joint pain or swelling. No myalgias.  SKIN: Negative for lesions, rash, and itching.  HEMATOLOGY/ONCOLOGY: Negative for prolonged bleeding, bruising easily, and swollen nodes. + history of cancer.  ENDOCRINE: No cold or heat intolerance, polydipsia or goiter.  PSYCH: No depression or anxiety symptoms.  NEURO: As Above.   Vital Signs:  BP 130/70 mmHg  Pulse 56  Wt 172 lb 1 oz (78.047 kg)  SpO2 96%  Neurological Exam: MENTAL STATUS including orientation to time, place, person is normal.  Speech is mildly dysarthric (old).  CRANIAL NERVES:  Pupils equal round and reactive to light.  Normal conjugate, extra-ocular eye movements in all directions of gaze.  No ptosis with sustained upward gaze.   Face is symmetric.  Frontalis, oribicularis oculi, orbicularis oris is 5/5. Buccinator is 5-/5 (stable).   Tongue is midline with normal strength.  MOTOR:  Motor strength of upper extremities is 5/5 bilaterally. There is very slight increase in tone in the right upper extremity. Residual weakness of the right lower extremity from old stroke, RLE is 3+/5.  LLE is 5/5  COORDINATION/GAIT: Gait shows stooped posture, he is dragging the right leg and supporting the right hand with his left.  Stable.   Data: Labs 03/05/2013: AChR binding 1.77*, CRP 3.6, ESR 6  MRI lumbar spine wo contrast 07/17/2013: 1. Acute to  subacute T12 compression fracture with mild loss of height. No retropulsion of bone. A benign osteoporotic fracture is  favored over pathologic fracture (e.g. due to T12 metastasis in this setting).  2. Meet marrow edema in the lower lumbar spine including at L5-S1 is favored to be degenerative in nature. No definite lumbar metastatic disease.  3. Chronically advanced lumbar spine degeneration, stable since 2011 except at L5-S1 where bilateral lateral recess stenosis appears progressed.   IMPRESSION/PLAN: 1. Generalized myasthenia gravis, AChR binding positive, thymoma negative (dx 03/2013)    He has remained stable with tapering dose of prednisone and was able to discontinue it in December 2016  If there is relapse, start prednisone 82m (he did not require greater than 183mpreviously)  Clinically stable on mestinon 6054mID   2. Due to T12 compression fracture, severe osteopenia  - Continues to walk independently, despite my strong recommendation to use cane or walker  - Noncompliant with home exercises  3.  Tobacco use, COPD. Again encouraged  to reduce tobacco use, but he is not interested  4. History of stroke with residual right hemisensory loss and right leg weakness  - on ASA, statin, ACEi   - Strongly encouraged to use a cane; high a fall risk and patient has known osteopenia with already one vertebral   - PT declined  5. History of left AION with residual visual field deficits  6. Carotid stenosis s/p L CEA (2008)   7. History of squamous cell carcinoma of the trachea, lung, bladder   Return to clinic in 4 months   The duration of this appointment visit was 20 minutes of face-to-face time with the patient.  Greater than 50% of this time was spent in counseling, explanation of diagnosis, planning of further management, and coordination of care.   Thank you for allowing me to participate in patient's care.  If I can answer any additional questions, I would be pleased to do  so.    Sincerely,    Donika K. Posey Pronto, DO

## 2015-04-24 DIAGNOSIS — I69359 Hemiplegia and hemiparesis following cerebral infarction affecting unspecified side: Secondary | ICD-10-CM | POA: Diagnosis not present

## 2015-04-24 DIAGNOSIS — E039 Hypothyroidism, unspecified: Secondary | ICD-10-CM | POA: Diagnosis not present

## 2015-04-24 DIAGNOSIS — M810 Age-related osteoporosis without current pathological fracture: Secondary | ICD-10-CM | POA: Diagnosis not present

## 2015-04-24 DIAGNOSIS — Z0000 Encounter for general adult medical examination without abnormal findings: Secondary | ICD-10-CM | POA: Diagnosis not present

## 2015-04-24 DIAGNOSIS — E7800 Pure hypercholesterolemia, unspecified: Secondary | ICD-10-CM | POA: Diagnosis not present

## 2015-04-24 DIAGNOSIS — J449 Chronic obstructive pulmonary disease, unspecified: Secondary | ICD-10-CM | POA: Diagnosis not present

## 2015-04-24 DIAGNOSIS — Z1389 Encounter for screening for other disorder: Secondary | ICD-10-CM | POA: Diagnosis not present

## 2015-04-24 DIAGNOSIS — I10 Essential (primary) hypertension: Secondary | ICD-10-CM | POA: Diagnosis not present

## 2015-04-24 DIAGNOSIS — G7000 Myasthenia gravis without (acute) exacerbation: Secondary | ICD-10-CM | POA: Diagnosis not present

## 2015-04-24 DIAGNOSIS — I739 Peripheral vascular disease, unspecified: Secondary | ICD-10-CM | POA: Diagnosis not present

## 2015-04-24 DIAGNOSIS — Z720 Tobacco use: Secondary | ICD-10-CM | POA: Diagnosis not present

## 2015-04-24 DIAGNOSIS — H6192 Disorder of left external ear, unspecified: Secondary | ICD-10-CM | POA: Diagnosis not present

## 2015-04-30 DIAGNOSIS — C44211 Basal cell carcinoma of skin of unspecified ear and external auricular canal: Secondary | ICD-10-CM | POA: Diagnosis not present

## 2015-04-30 DIAGNOSIS — L821 Other seborrheic keratosis: Secondary | ICD-10-CM | POA: Diagnosis not present

## 2015-04-30 DIAGNOSIS — C44219 Basal cell carcinoma of skin of left ear and external auricular canal: Secondary | ICD-10-CM | POA: Diagnosis not present

## 2015-06-11 DIAGNOSIS — Z85828 Personal history of other malignant neoplasm of skin: Secondary | ICD-10-CM | POA: Diagnosis not present

## 2015-06-11 DIAGNOSIS — L821 Other seborrheic keratosis: Secondary | ICD-10-CM | POA: Diagnosis not present

## 2015-06-11 DIAGNOSIS — L570 Actinic keratosis: Secondary | ICD-10-CM | POA: Diagnosis not present

## 2015-06-11 DIAGNOSIS — L820 Inflamed seborrheic keratosis: Secondary | ICD-10-CM | POA: Diagnosis not present

## 2015-07-03 ENCOUNTER — Encounter: Payer: Self-pay | Admitting: Neurology

## 2015-07-03 ENCOUNTER — Ambulatory Visit (INDEPENDENT_AMBULATORY_CARE_PROVIDER_SITE_OTHER): Payer: PPO | Admitting: Neurology

## 2015-07-03 VITALS — BP 128/70 | HR 61 | Ht 72.0 in | Wt 166.4 lb

## 2015-07-03 DIAGNOSIS — G7000 Myasthenia gravis without (acute) exacerbation: Secondary | ICD-10-CM | POA: Diagnosis not present

## 2015-07-03 DIAGNOSIS — M4806 Spinal stenosis, lumbar region: Secondary | ICD-10-CM

## 2015-07-03 DIAGNOSIS — Z720 Tobacco use: Secondary | ICD-10-CM

## 2015-07-03 DIAGNOSIS — M48062 Spinal stenosis, lumbar region with neurogenic claudication: Secondary | ICD-10-CM

## 2015-07-03 MED ORDER — PYRIDOSTIGMINE BROMIDE 60 MG PO TABS: 60.0000 mg | ORAL_TABLET | Freq: Three times a day (TID) | ORAL | Status: DC

## 2015-07-03 NOTE — Progress Notes (Signed)
Follow-up Visit   Date: 07/03/2015    Chase Nguyen MRN: [id number] DOB: October 10, 1935   Interim History: Chase Nguyen is a 80 y.o. 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 About two weeks later, he developed droopiness of his eyes with diurnal variation. He denies double vision, problems with talking, or shortness of breath. His wife says that he has always tended to keep his neck flexed for several years, but it has worsened over the past several weeks. He has occasional problems with swallowing.   He saw Dr. Celesta Aver, opthalmology and recommended lab testing for myasthenia. His acetylcholine receptor binding antibody return positive (1.77, normal <0.24).  He was initially seen in the clinic on 03/13/2013 at which time mestinon 74m TID and prednisone 162mwas started.  He had significant improvement with ptosis and neck strength after taking mestinon.   In June 2015, he was found to have vertebral T12 compression fracture and severe osteopenia.    He was on a slow prednisone taper (55m24month) and successfully stopped corticosteorids in December 2016.    UPDATE 07/03/2015:  He is doing great off prednisone and has been complaint with mestinon 1m15mD.  No interval falls, but endorses imbalance and weakness of the right leg (old stroke).  He also complains of legs giving out and low back pain especially with prolonged walking and walking uphill.  If he rests, his low back pain is improved. He still does not use a cane and  fortunately, has not had any falls.    Medications:  Current Outpatient Prescriptions on File Prior to Visit  Medication Sig Dispense Refill  . amLODipine (NORVASC) 10 MG tablet Take 10 mg by mouth daily.     . asMarland Kitchenirin 325 MG tablet Take 325 mg by mouth daily.    . benazepril-hydrochlorthiazide (LOTENSIN HCT) 20-12.5 MG per tablet Take 1 tablet by mouth daily.    . Cholecalciferol (VITAMIN D-3 PO) Take by mouth.    . levothyroxine (SYNTHROID, LEVOTHROID) 112 MCG tablet Take 112 mcg by mouth daily before breakfast.    . Multiple Vitamins-Minerals (PRESERVISION AREDS PO) Take 2 tablets by mouth daily.    . pravastatin (PRAVACHOL) 40 MG tablet Take 40 mg by mouth daily.      . vitamin B-12 (CYANOCOBALAMIN) 100 MCG tablet Take 100 mcg by mouth daily.     No current facility-administered medications on file prior to visit.    Allergies: None  Review of Systems:  CONSTITUTIONAL: No fevers, chills, night sweats.  EYES: +visual changes or eye pain  ENT: +hearing changes. No history of nose bleeds.  RESPIRATORY: No cough, wheezing, shortness of breath.  CARDIOVASCULAR: Negative for chest pain, and palpitations.  GI: Negative for abdominal discomfort, blood in stools or black stools. No recent change in bowel habits.  GU: No history of incontinence.  MUSCLOSKELETAL: + history of joint pain or swelling. No myalgias.  SKIN: Negative for lesions, rash, and itching.  HEMATOLOGY/ONCOLOGY: Negative for prolonged bleeding, bruising easily, and swollen nodes. + history of cancer.  ENDOCRINE:  No cold or heat intolerance, polydipsia or goiter.  PSYCH: No depression or anxiety symptoms.  NEURO: As Above.   Vital Signs:  BP 128/70 mmHg  Pulse 61  Ht 6' (1.829 m)  Wt 166 lb 7 oz (75.496 kg)  BMI 22.57 kg/m2  SpO2 98%  Neurological Exam: MENTAL STATUS including orientation to time, place, person is normal.  Speech is mildly dysarthric (old).  CRANIAL NERVES:  Pupils equal round and reactive to  light.  Normal conjugate, extra-ocular eye movements in all directions of gaze.  No ptosis with sustained upward gaze.   Face is symmetric.  Frontalis, oribicularis oculi, orbicularis oris is 5/5. Buccinator is 5-/5 (stable).   Tongue is midline with normal strength.  MOTOR:  Motor strength of upper extremities is 5/5 bilaterally. There is very slight increase in tone in the right upper extremity. Residual weakness of the right lower extremity from old stroke, RLE is 3+/5.  LLE is 5/5  REFLEXES:  2+/4 in the upper extremities, 3+/4 patella, and trace Achilles  COORDINATION/GAIT: Gait shows stooped posture, he is dragging the right leg and supporting the right hand with his left.  Stable.   Data: Labs 03/05/2013: AChR binding 1.77*, CRP 3.6, ESR 6  MRI lumbar spine wo contrast 07/17/2013: 1. Acute to subacute T12 compression fracture with mild loss of height. No retropulsion of bone. A benign osteoporotic fracture is  favored over pathologic fracture (e.g. due to T12 metastasis in this setting).  2. Meet marrow edema in the lower lumbar spine including at L5-S1 is favored to be degenerative in nature. No definite lumbar metastatic disease.  3. Chronically advanced lumbar spine degeneration, stable since 2011 except at L5-S1 where bilateral lateral recess stenosis appears progressed.   IMPRESSION/PLAN: 1. Generalized myasthenia gravis, AChR binding positive, thymoma negative (dx 03/2013), clinically stable  - He has remained stable with tapering dose of prednisone and was able to discontinue it in December 2016  - If there is relapse, start prednisone 86m (he did not require greater than 150mpreviously)  - Continue mestinon 6070mID   2. Spinal stenosis with neurogenic claudication, chronic low back pain due to advanced degenerative changes of the lumbar spine and foraminal stenosis.  History of T12 compression fracture, severe osteopenia  - Continues to walk independently, despite my strong  recommendation to use cane or walker  - Start home physical therapy for right leg weakness and gait instability  3. History of stroke with residual right hemisensory loss and right leg weakness, carotid stenosis s/p L CEA (2008)   - on ASA, statin, ACEi   - Strongly encouraged to use a cane; high a fall risk and patient has known osteopenia with already one vertebral   4.  Tobacco use, COPD. Again encouraged to reduce tobacco use  5. History of left AION with residual visual field deficits  6. History of squamous cell carcinoma of the trachea, lung, bladder   Return to clinic 6 months   The duration of this appointment visit was 25 minutes of face-to-face time with the patient.  Greater than 50% of this time was spent in counseling, explanation of diagnosis, planning of further management, and coordination of care.   Thank you for allowing me to participate in patient's care.  If I can answer any additional questions, I would be pleased to do so.    Sincerely,    Donika K. PatPosey ProntoO

## 2015-07-03 NOTE — Patient Instructions (Addendum)
1.  Start home physical therapy for right leg weakness and balance training 2.  Continue mestinon '60mg'$  three times daily 3.  Encouraged to stop smoking 4.  Start using a cane  Return to clinic in 6 months

## 2015-07-04 DIAGNOSIS — Z8551 Personal history of malignant neoplasm of bladder: Secondary | ICD-10-CM | POA: Diagnosis not present

## 2015-07-04 DIAGNOSIS — I69341 Monoplegia of lower limb following cerebral infarction affecting right dominant side: Secondary | ICD-10-CM | POA: Diagnosis not present

## 2015-07-04 DIAGNOSIS — G7000 Myasthenia gravis without (acute) exacerbation: Secondary | ICD-10-CM | POA: Diagnosis not present

## 2015-07-04 DIAGNOSIS — Z9181 History of falling: Secondary | ICD-10-CM | POA: Diagnosis not present

## 2015-07-04 DIAGNOSIS — Z8512 Personal history of malignant neoplasm of trachea: Secondary | ICD-10-CM | POA: Diagnosis not present

## 2015-07-04 DIAGNOSIS — F17210 Nicotine dependence, cigarettes, uncomplicated: Secondary | ICD-10-CM | POA: Diagnosis not present

## 2015-07-04 DIAGNOSIS — J449 Chronic obstructive pulmonary disease, unspecified: Secondary | ICD-10-CM | POA: Diagnosis not present

## 2015-07-04 DIAGNOSIS — I6522 Occlusion and stenosis of left carotid artery: Secondary | ICD-10-CM | POA: Diagnosis not present

## 2015-07-04 DIAGNOSIS — I10 Essential (primary) hypertension: Secondary | ICD-10-CM | POA: Diagnosis not present

## 2015-07-04 DIAGNOSIS — M4806 Spinal stenosis, lumbar region: Secondary | ICD-10-CM | POA: Diagnosis not present

## 2015-07-17 ENCOUNTER — Other Ambulatory Visit: Payer: Self-pay | Admitting: *Deleted

## 2015-07-17 DIAGNOSIS — Z48812 Encounter for surgical aftercare following surgery on the circulatory system: Secondary | ICD-10-CM

## 2015-07-17 DIAGNOSIS — I6523 Occlusion and stenosis of bilateral carotid arteries: Secondary | ICD-10-CM

## 2015-07-23 ENCOUNTER — Encounter: Payer: Self-pay | Admitting: Family

## 2015-07-25 DIAGNOSIS — Z8512 Personal history of malignant neoplasm of trachea: Secondary | ICD-10-CM | POA: Diagnosis not present

## 2015-07-25 DIAGNOSIS — J449 Chronic obstructive pulmonary disease, unspecified: Secondary | ICD-10-CM | POA: Diagnosis not present

## 2015-07-25 DIAGNOSIS — Z9181 History of falling: Secondary | ICD-10-CM | POA: Diagnosis not present

## 2015-07-25 DIAGNOSIS — Z8551 Personal history of malignant neoplasm of bladder: Secondary | ICD-10-CM | POA: Diagnosis not present

## 2015-07-25 DIAGNOSIS — I6522 Occlusion and stenosis of left carotid artery: Secondary | ICD-10-CM | POA: Diagnosis not present

## 2015-07-25 DIAGNOSIS — F17210 Nicotine dependence, cigarettes, uncomplicated: Secondary | ICD-10-CM | POA: Diagnosis not present

## 2015-07-25 DIAGNOSIS — M4806 Spinal stenosis, lumbar region: Secondary | ICD-10-CM | POA: Diagnosis not present

## 2015-07-25 DIAGNOSIS — G7000 Myasthenia gravis without (acute) exacerbation: Secondary | ICD-10-CM | POA: Diagnosis not present

## 2015-07-25 DIAGNOSIS — I69341 Monoplegia of lower limb following cerebral infarction affecting right dominant side: Secondary | ICD-10-CM | POA: Diagnosis not present

## 2015-07-25 DIAGNOSIS — I10 Essential (primary) hypertension: Secondary | ICD-10-CM | POA: Diagnosis not present

## 2015-07-29 ENCOUNTER — Ambulatory Visit (INDEPENDENT_AMBULATORY_CARE_PROVIDER_SITE_OTHER): Payer: PPO | Admitting: Family

## 2015-07-29 ENCOUNTER — Encounter: Payer: Self-pay | Admitting: Family

## 2015-07-29 ENCOUNTER — Ambulatory Visit (HOSPITAL_COMMUNITY)
Admission: RE | Admit: 2015-07-29 | Discharge: 2015-07-29 | Disposition: A | Discharge status: Home / Self Care | Payer: PPO | Source: Ambulatory Visit | Attending: Family | Admitting: Family

## 2015-07-29 VITALS — BP 166/73 | HR 50 | Ht 72.0 in | Wt 166.3 lb

## 2015-07-29 DIAGNOSIS — I6523 Occlusion and stenosis of bilateral carotid arteries: Secondary | ICD-10-CM | POA: Diagnosis not present

## 2015-07-29 DIAGNOSIS — Z48812 Encounter for surgical aftercare following surgery on the circulatory system: Secondary | ICD-10-CM

## 2015-07-29 DIAGNOSIS — I6522 Occlusion and stenosis of left carotid artery: Secondary | ICD-10-CM | POA: Diagnosis not present

## 2015-07-29 DIAGNOSIS — I10 Essential (primary) hypertension: Secondary | ICD-10-CM | POA: Insufficient documentation

## 2015-07-29 DIAGNOSIS — Z8673 Personal history of transient ischemic attack (TIA), and cerebral infarction without residual deficits: Secondary | ICD-10-CM

## 2015-07-29 DIAGNOSIS — Z9889 Other specified postprocedural states: Secondary | ICD-10-CM | POA: Diagnosis not present

## 2015-07-29 DIAGNOSIS — Z720 Tobacco use: Secondary | ICD-10-CM

## 2015-07-29 DIAGNOSIS — J449 Chronic obstructive pulmonary disease, unspecified: Secondary | ICD-10-CM | POA: Diagnosis not present

## 2015-07-29 DIAGNOSIS — I6521 Occlusion and stenosis of right carotid artery: Secondary | ICD-10-CM | POA: Diagnosis not present

## 2015-07-29 DIAGNOSIS — F17200 Nicotine dependence, unspecified, uncomplicated: Secondary | ICD-10-CM

## 2015-07-29 DIAGNOSIS — E785 Hyperlipidemia, unspecified: Secondary | ICD-10-CM | POA: Diagnosis not present

## 2015-07-29 LAB — VAS US CAROTID
LCCADSYS: 131 cm/s
LCCAPSYS: 111 cm/s
LICADSYS: -88 cm/s
Left CCA dist dias: 16 cm/s
Left CCA prox dias: 16 cm/s
Left ICA dist dias: -17 cm/s
Left ICA prox dias: 25 cm/s
Left ICA prox sys: 145 cm/s

## 2015-07-29 NOTE — Progress Notes (Addendum)
Chief Complaint: Follow up Extracranial Carotid Artery Stenosis   History of Present Illness  Chase Nguyen is a 80 y.o. male patient of Dr. Donnetta Hutching who is status post left CEA in October 2008. He has known occlusion of the right common and internal carotid arteries. He returns today for follow up. He had a stroke, as manifested by loss of vision in his left eye, right leg weakness, no right arm weakness, just before the 2008 left CEA. He continues to have loss of vision in his left eye, left leg weakness. He denies aphasia.  He has had no further known stroke or TIA symptoms.  He denies tingling, numbness, pain, or cold feeling in either upper extremity.  Pt denies claudication symptoms with walking, denies non healing wounds.  He is receiving physical therapy for strengthening, requested by his neurologist, Dr. Posey Pronto.  He has inflammation in his back.  He is active with gardening.  Pt Diabetic: No Pt smoker: resumed smoking from Feb, 2015 when he quit for 8 months  Pt meds include: Statin : Yes ASA: Yes Other anticoagulants/antiplatelets: no   Past Medical History  Diagnosis Date  . Left inguinal hernia   . Tobacco abuse   . Hx of bladder cancer   . Hyperlipemia   . HTN (hypertension)   . Bilateral cataracts   . Skin cancer     RIGHT EAR AND BACK  . Family hx of lung cancer   . Internal carotid artery occlusion   . Obstructive airway disease ([place])   . Degenerative arthritis of spine     AND HIPS  . 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  . Emphysema of lung ([place])   . Cancer of lower lobe of lung ([place]) 04/05/2011  . COPD (chronic obstructive pulmonary disease) ([place])   . Stroke [facility])     Social History Social History  Substance Use Topics  . Smoking status: Current Every Day Smoker -- 1.00 packs/day for 71 years    Types: Cigarettes  . Smokeless tobacco: Former Systems developer  .  Alcohol Use: No    Family History Family History  Problem Relation Age of Onset  . Heart attack Father   . Hypertension Father   . Heart disease Father   . Varicose Veins Father   . Cancer Mother   . Cancer Sister   . Stroke Sister   . Diabetes Brother     Surgical History Past Surgical History  Procedure Laterality Date  . L vats  11/ 30/ 1999  . Left lower lobectomy  05/09/1998  . Tympanoplasty  07/11/1998    WITH OSSICULOPLASTY/LEFT  . Flexible bronchoscopy w/ laser  05/22/1998,07/06/1999,09/21/1999,04/07/2000  . Video bronchoscopy  11/02/1998  . Myringotomy  07/06/1999    with left tube placement  . Cystoscopy,transurethral resection of bladder  12/22/1999  . Fiberoptic bronch,bx of bronchial stem,bx of distal tracheal lesion,laser of distal tracheal lesion  06/02/1999  . Right canal wall down tympanoplasty/mastoidectomy,,myringotomy  08/23/2001  . Cataract extraction    . Eye surgery      No Known Allergies  Current Outpatient Prescriptions  Medication Sig Dispense Refill  . amLODipine (NORVASC) 10 MG tablet Take 10 mg by mouth daily.     Marland Kitchen aspirin 325 MG tablet Take 325 mg by mouth daily.    . benazepril-hydrochlorthiazide (LOTENSIN HCT) 20-12.5 MG per tablet Take 1 tablet by mouth daily.    Marland Kitchen  Cholecalciferol (VITAMIN D-3 PO) Take by mouth.    . levothyroxine (SYNTHROID, LEVOTHROID) 112 MCG tablet Take 112 mcg by mouth daily before breakfast.    . Multiple Vitamins-Minerals (PRESERVISION AREDS PO) Take 2 tablets by mouth daily.    . pravastatin (PRAVACHOL) 40 MG tablet Take 40 mg by mouth daily.      Marland Kitchen pyridostigmine (MESTINON) 60 MG tablet Take 1 tablet (60 mg total) by mouth 3 (three) times daily. 90 tablet 11  . vitamin B-12 (CYANOCOBALAMIN) 100 MCG tablet Take 100 mcg by mouth daily.     No current facility-administered medications for this visit.    Review of Systems : See HPI for pertinent positives and negatives.  Physical Examination  Filed Vitals:    07/29/15 0910 07/29/15 0912  BP: 180/72 166/73  Pulse: 50   Height: 6' (1.829 m)   Weight: 166 lb 4.8 oz (75.433 kg)   SpO2: 97%    Body mass index is 22.55 kg/(m^2).  General: WDWN male in NAD GAIT: slow and deliberate Eyes: PERRLA Pulmonary: Respirations are non-labored, CTAB, good air movement Cardiac: Regular rhythm, + murmur.  VASCULAR EXAM Carotid Bruits Left Right   positive Positive   Aorta is not palpable. Radial pulses: right is 2+, left is 2+ palpable.      Gastrointestinal: soft, nontender, BS WNL, no r/g, no palpable masses.  Musculoskeletal: No muscle atrophy/wasting. M/S 4/5 throughout except 3/5 in right LE, extremities without ischemic changes. trace pitting edema in right ankle.  Neurologic: A&O X 3; Appropriate Affect, Speech is normal CN 2-12 intact except is hard of hearing (hearing aid in place), Pain and light touch intact in extremities, Motor exam as listed above.               Non-Invasive Vascular Imaging CAROTID DUPLEX 07/29/2015    CEREBROVASCULAR DUPLEX EVALUATION    INDICATION: Carotid endarterectomy     PREVIOUS INTERVENTION(S): Left carotid endarterectomy on 11/28/06    DUPLEX EXAM:     RIGHT  LEFT  Peak Systolic Velocities (cm/s) End Diastolic Velocities (cm/s) Plaque LOCATION Peak Systolic Velocities (cm/s) End Diastolic Velocities (cm/s) Plaque     CCA PROXIMAL 111 16 HT     CCA MID 120 15 HT     CCA DISTAL 131 16      ECA 137 0      ICA PROXIMAL 145 25 HT     ICA MID 79 14      ICA DISTAL 88 17      Vertebral Flow Dampened/Antegrade   Brachial Systolic Pressure (mmHg)   Multiphasic (subclavian artery) Subclavian Artery Waveforms Multiphasic (subclavian artery)    Plaque Morphology:  HM = Homogeneous, HT = Heterogeneous, CP = Calcific Plaque, SP = Smooth Plaque, IP =  Irregular Plaque     ADDITIONAL FINDINGS: . The right carotid system was not fully evaluated due to known right common carotid and internal carotid artery occlusions on previous exams. . No significant stenosis of the left external or common carotid arteries. . Limited visualization of the left vertebral artery suggests dampened, antegrade flow however this may be collateral flow. . Velocity of 270 cm/s noted in the left proximal subclavian artery.    IMPRESSION: Patent left carotid endarterectomy site with Doppler velocities suggestive of 1-39% stenosis left internal carotid artery. Known right carotid artery occlusions on previous exams, as noted above.    Compared to the previous exam:  No significant change in the left internal carotid artery velocities when compared  to the previous exam on 07/17/2014.      Assessment: Chase Nguyen is a 80 y.o. male who is status post left CEA in October 2008. He has known occlusion of the right common and internal carotid arteries. He had a stroke, as manifested by loss of vision in his left eye, left leg weakness, no left arm weakness, just before the 2008 left CEA. He continues to have loss of vision in his left eye, left leg weakness. He denies aphasia. He has had no further known stroke or TIA symptoms.    Today's carotid Duplex suggests left carotid endarterectomy site with Doppler velocities suggestive of 1-39% stenosis left internal carotid artery. Known right carotid artery occlusions on previous exams. No significant change in the left internal carotid artery velocities when compared to the previous exam on 07/17/2014.   Fortunately he does not have DM, but unfortunately he continues to smoke; see Plan. I advised pt to see his PCP as soon as possible re his elevated blood pressure.   Plan: The patient was counseled re smoking cessation and given several free resources re smoking cessation.   Follow-up in 1 year with Carotid Duplex scan.    I discussed in depth with the patient the nature of atherosclerosis, and emphasized the importance of maximal medical management including strict control of blood pressure, blood glucose, and lipid levels, obtaining regular exercise, and cessation of smoking.  The patient is aware that without maximal medical management the underlying atherosclerotic disease process will progress, limiting the benefit of any interventions. The patient was given information about stroke prevention and what symptoms should prompt the patient to seek immediate medical care. Thank you for allowing [place] to participate in this patient's care.  Clemon Chambers, RN, MSN, FNP-C [facility] Office: [phone]  Clinic Physician: Early  07/29/2015 9:14 AM

## 2015-07-29 NOTE — Patient Instructions (Addendum)
Stroke Prevention Some medical conditions and behaviors are associated with an increased chance of having a stroke. You may prevent a stroke by making healthy choices and managing medical conditions. HOW CAN I REDUCE MY RISK OF HAVING A STROKE?   Stay physically active. Get at least 30 minutes of activity on most or all days.  Do not smoke. It may also be helpful to avoid exposure to secondhand smoke.  Limit alcohol use. Moderate alcohol use is considered to be:  No more than 2 drinks per day for men.  No more than 1 drink per day for nonpregnant women.  Eat healthy foods. This involves:  Eating 5 or more servings of fruits and vegetables a day.  Making dietary changes that address high blood pressure (hypertension), high cholesterol, diabetes, or obesity.  Manage your cholesterol levels.  Making food choices that are high in fiber and low in saturated fat, trans fat, and cholesterol may control cholesterol levels.  Take any prescribed medicines to control cholesterol as directed by your health care provider.  Manage your diabetes.  Controlling your carbohydrate and sugar intake is recommended to manage diabetes.  Take any prescribed medicines to control diabetes as directed by your health care provider.  Control your hypertension.  Making food choices that are low in salt (sodium), saturated fat, trans fat, and cholesterol is recommended to manage hypertension.  Ask your health care provider if you need treatment to lower your blood pressure. Take any prescribed medicines to control hypertension as directed by your health care provider.  If you are 18-39 years of age, have your blood pressure checked every 3-5 years. If you are 40 years of age or older, have your blood pressure checked every year.  Maintain a healthy weight.  Reducing calorie intake and making food choices that are low in sodium, saturated fat, trans fat, and cholesterol are recommended to manage  weight.  Stop drug abuse.  Avoid taking birth control pills.  Talk to your health care provider about the risks of taking birth control pills if you are over 35 years old, smoke, get migraines, or have ever had a blood clot.  Get evaluated for sleep disorders (sleep apnea).  Talk to your health care provider about getting a sleep evaluation if you snore a lot or have excessive sleepiness.  Take medicines only as directed by your health care provider.  For some people, aspirin or blood thinners (anticoagulants) are helpful in reducing the risk of forming abnormal blood clots that can lead to stroke. If you have the irregular heart rhythm of atrial fibrillation, you should be on a blood thinner unless there is a good reason you cannot take them.  Understand all your medicine instructions.  Make sure that other conditions (such as anemia or atherosclerosis) are addressed. SEEK IMMEDIATE MEDICAL CARE IF:   You have sudden weakness or numbness of the face, arm, or leg, especially on one side of the body.  Your face or eyelid droops to one side.  You have sudden confusion.  You have trouble speaking (aphasia) or understanding.  You have sudden trouble seeing in one or both eyes.  You have sudden trouble walking.  You have dizziness.  You have a loss of balance or coordination.  You have a sudden, severe headache with no known cause.  You have new chest pain or an irregular heartbeat. Any of these symptoms may represent a serious problem that is an emergency. Do not wait to see if the symptoms will   go away. Get medical help at once. Call your local emergency services (911 in [place] Do not drive yourself to the hospital.   This information is not intended to replace advice given to you by your health care provider. Make sure you discuss any questions you have with your health care provider.   Document Released: 02/26/2004 Document Revised: 02/08/2014 Document Reviewed:  07/21/2012 Elsevier Interactive Patient Education 2016 [employer] Inc[employer]     Steps to Quit Smoking  Smoking tobacco can be harmful to your health and can affect almost every organ in your body. Smoking puts you, and those around you, at risk for developing many serious chronic diseases. Quitting smoking is difficult, but it is one of the best things that you can do for your health. It is never too late to quit. WHAT ARE THE BENEFITS OF QUITTING SMOKING? When you quit smoking, you lower your risk of developing serious diseases and conditions, such as:  Lung cancer or lung disease, such as COPD.  Heart disease.  Stroke.  Heart attack.  Infertility.  Osteoporosis and bone fractures. Additionally, symptoms such as coughing, wheezing, and shortness of breath may get better when you quit. You may also find that you get sick less often because your body is stronger at fighting off colds and infections. If you are pregnant, quitting smoking can help to reduce your chances of having a baby of low birth weight. HOW DO I GET READY TO QUIT? When you decide to quit smoking, create a plan to make sure that you are successful. Before you quit:  Pick a date to quit. Set a date within the next two weeks to give you time to prepare.  Write down the reasons why you are quitting. Keep this list in places where you will see it often, such as on your bathroom mirror or in your car or wallet.  Identify the people, places, things, and activities that make you want to smoke (triggers) and avoid them. Make sure to take these actions:  Throw away all cigarettes at home, at work, and in your car.  Throw away smoking accessories, such as ashtrays and lighters.  Clean your car and make sure to empty the ashtray.  Clean your home, including curtains and carpets.  Tell your family, friends, and coworkers that you are quitting. Support from your loved ones can make quitting easier.  Talk with your health care  provider about your options for quitting smoking.  Find out what treatment options are covered by your health insurance. WHAT STRATEGIES CAN I USE TO QUIT SMOKING?  Talk with your healthcare provider about different strategies to quit smoking. Some strategies include:  Quitting smoking altogether instead of gradually lessening how much you smoke over a period of time. Research shows that quitting "cold turkey" is more successful than gradually quitting.  Attending in-person counseling to help you build problem-solving skills. You are more likely to have success in quitting if you attend several counseling sessions. Even short sessions of 10 minutes can be effective.  Finding resources and support systems that can help you to quit smoking and remain smoke-free after you quit. These resources are most helpful when you use them often. They can include:  Online chats with a counselor.  Telephone quitlines.  Printed self-help materials.  Support groups or group counseling.  Text messaging programs.  Mobile phone applications.  Taking medicines to help you quit smoking. (If you are pregnant or breastfeeding, talk with your health care provider first.) Some   medicines contain nicotine and some do not. Both types of medicines help with cravings, but the medicines that include nicotine help to relieve withdrawal symptoms. Your health care provider may recommend:  Nicotine patches, gum, or lozenges.  Nicotine inhalers or sprays.  Non-nicotine medicine that is taken by mouth. Talk with your health care provider about combining strategies, such as taking medicines while you are also receiving in-person counseling. Using these two strategies together makes you more likely to succeed in quitting than if you used either strategy on its own. If you are pregnant or breastfeeding, talk with your health care provider about finding counseling or other support strategies to quit smoking. Do not take  medicine to help you quit smoking unless told to do so by your health care provider. WHAT THINGS CAN I DO TO MAKE IT EASIER TO QUIT? Quitting smoking might feel overwhelming at first, but there is a lot that you can do to make it easier. Take these important actions:  Reach out to your family and friends and ask that they support and encourage you during this time. Call telephone quitlines, reach out to support groups, or work with a counselor for support.  Ask people who smoke to avoid smoking around you.  Avoid places that trigger you to smoke, such as bars, parties, or smoke-break areas at work.  Spend time around people who do not smoke.  Lessen stress in your life, because stress can be a smoking trigger for some people. To lessen stress, try:  Exercising regularly.  Deep-breathing exercises.  Yoga.  Meditating.  Performing a body scan. This involves closing your eyes, scanning your body from head to toe, and noticing which parts of your body are particularly tense. Purposefully relax the muscles in those areas.  Download or purchase mobile phone or tablet apps (applications) that can help you stick to your quit plan by providing reminders, tips, and encouragement. There are many free apps, such as QuitGuide from the CDC (Centers for Disease Control and Prevention). You can find other support for quitting smoking (smoking cessation) through smokefree.gov and other websites. HOW WILL I FEEL WHEN I QUIT SMOKING? Within the first 24 hours of quitting smoking, you may start to feel some withdrawal symptoms. These symptoms are usually most noticeable 2-3 days after quitting, but they usually do not last beyond 2-3 weeks. Changes or symptoms that you might experience include:  Mood swings.  Restlessness, anxiety, or irritation.  Difficulty concentrating.  Dizziness.  Strong cravings for sugary foods in addition to nicotine.  Mild weight  gain.  Constipation.  Nausea.  Coughing or a sore throat.  Changes in how your medicines work in your body.  A depressed mood.  Difficulty sleeping (insomnia). After the first 2-3 weeks of quitting, you may start to notice more positive results, such as:  Improved sense of smell and taste.  Decreased coughing and sore throat.  Slower heart rate.  Lower blood pressure.  Clearer skin.  The ability to breathe more easily.  Fewer sick days. Quitting smoking is very challenging for most people. Do not get discouraged if you are not successful the first time. Some people need to make many attempts to quit before they achieve long-term success. Do your best to stick to your quit plan, and talk with your health care provider if you have any questions or concerns.   This information is not intended to replace advice given to you by your health care provider. Make sure you discuss any questions   you have with your health care provider.   Document Released: 01/12/2001 Document Revised: 06/04/2014 Document Reviewed: 06/04/2014 Elsevier Interactive Patient Education 2016 Elsevier Inc.  

## 2015-08-07 DIAGNOSIS — I10 Essential (primary) hypertension: Secondary | ICD-10-CM | POA: Diagnosis not present

## 2015-08-07 DIAGNOSIS — Z8512 Personal history of malignant neoplasm of trachea: Secondary | ICD-10-CM | POA: Diagnosis not present

## 2015-08-07 DIAGNOSIS — J449 Chronic obstructive pulmonary disease, unspecified: Secondary | ICD-10-CM | POA: Diagnosis not present

## 2015-08-07 DIAGNOSIS — I6522 Occlusion and stenosis of left carotid artery: Secondary | ICD-10-CM | POA: Diagnosis not present

## 2015-08-07 DIAGNOSIS — I69341 Monoplegia of lower limb following cerebral infarction affecting right dominant side: Secondary | ICD-10-CM | POA: Diagnosis not present

## 2015-08-07 DIAGNOSIS — Z8551 Personal history of malignant neoplasm of bladder: Secondary | ICD-10-CM | POA: Diagnosis not present

## 2015-08-07 DIAGNOSIS — F17210 Nicotine dependence, cigarettes, uncomplicated: Secondary | ICD-10-CM | POA: Diagnosis not present

## 2015-08-07 DIAGNOSIS — G7000 Myasthenia gravis without (acute) exacerbation: Secondary | ICD-10-CM | POA: Diagnosis not present

## 2015-08-07 DIAGNOSIS — Z9181 History of falling: Secondary | ICD-10-CM | POA: Diagnosis not present

## 2015-08-07 DIAGNOSIS — M4806 Spinal stenosis, lumbar region: Secondary | ICD-10-CM | POA: Diagnosis not present

## 2015-08-19 DIAGNOSIS — J449 Chronic obstructive pulmonary disease, unspecified: Secondary | ICD-10-CM | POA: Diagnosis not present

## 2015-08-19 DIAGNOSIS — Z9181 History of falling: Secondary | ICD-10-CM | POA: Diagnosis not present

## 2015-08-19 DIAGNOSIS — I69341 Monoplegia of lower limb following cerebral infarction affecting right dominant side: Secondary | ICD-10-CM | POA: Diagnosis not present

## 2015-08-19 DIAGNOSIS — M4806 Spinal stenosis, lumbar region: Secondary | ICD-10-CM | POA: Diagnosis not present

## 2015-08-19 DIAGNOSIS — Z8512 Personal history of malignant neoplasm of trachea: Secondary | ICD-10-CM | POA: Diagnosis not present

## 2015-08-19 DIAGNOSIS — I6522 Occlusion and stenosis of left carotid artery: Secondary | ICD-10-CM | POA: Diagnosis not present

## 2015-08-19 DIAGNOSIS — I10 Essential (primary) hypertension: Secondary | ICD-10-CM | POA: Diagnosis not present

## 2015-08-19 DIAGNOSIS — G7000 Myasthenia gravis without (acute) exacerbation: Secondary | ICD-10-CM | POA: Diagnosis not present

## 2015-08-19 DIAGNOSIS — Z8551 Personal history of malignant neoplasm of bladder: Secondary | ICD-10-CM | POA: Diagnosis not present

## 2015-08-19 DIAGNOSIS — F17210 Nicotine dependence, cigarettes, uncomplicated: Secondary | ICD-10-CM | POA: Diagnosis not present

## 2015-09-02 DIAGNOSIS — I69341 Monoplegia of lower limb following cerebral infarction affecting right dominant side: Secondary | ICD-10-CM | POA: Diagnosis not present

## 2015-09-02 DIAGNOSIS — Z8551 Personal history of malignant neoplasm of bladder: Secondary | ICD-10-CM | POA: Diagnosis not present

## 2015-09-02 DIAGNOSIS — G7000 Myasthenia gravis without (acute) exacerbation: Secondary | ICD-10-CM | POA: Diagnosis not present

## 2015-09-02 DIAGNOSIS — I10 Essential (primary) hypertension: Secondary | ICD-10-CM | POA: Diagnosis not present

## 2015-09-02 DIAGNOSIS — I6522 Occlusion and stenosis of left carotid artery: Secondary | ICD-10-CM | POA: Diagnosis not present

## 2015-09-02 DIAGNOSIS — F17210 Nicotine dependence, cigarettes, uncomplicated: Secondary | ICD-10-CM | POA: Diagnosis not present

## 2015-09-02 DIAGNOSIS — M4806 Spinal stenosis, lumbar region: Secondary | ICD-10-CM | POA: Diagnosis not present

## 2015-09-02 DIAGNOSIS — J449 Chronic obstructive pulmonary disease, unspecified: Secondary | ICD-10-CM | POA: Diagnosis not present

## 2015-09-02 DIAGNOSIS — Z8512 Personal history of malignant neoplasm of trachea: Secondary | ICD-10-CM | POA: Diagnosis not present

## 2015-09-02 DIAGNOSIS — Z9181 History of falling: Secondary | ICD-10-CM | POA: Diagnosis not present

## 2015-09-10 DIAGNOSIS — Z85828 Personal history of other malignant neoplasm of skin: Secondary | ICD-10-CM | POA: Diagnosis not present

## 2015-09-10 DIAGNOSIS — D485 Neoplasm of uncertain behavior of skin: Secondary | ICD-10-CM | POA: Diagnosis not present

## 2015-09-10 DIAGNOSIS — L570 Actinic keratosis: Secondary | ICD-10-CM | POA: Diagnosis not present

## 2015-09-23 DIAGNOSIS — Z9181 History of falling: Secondary | ICD-10-CM | POA: Diagnosis not present

## 2015-09-23 DIAGNOSIS — I69341 Monoplegia of lower limb following cerebral infarction affecting right dominant side: Secondary | ICD-10-CM | POA: Diagnosis not present

## 2015-09-23 DIAGNOSIS — M4806 Spinal stenosis, lumbar region: Secondary | ICD-10-CM | POA: Diagnosis not present

## 2015-09-23 DIAGNOSIS — G7000 Myasthenia gravis without (acute) exacerbation: Secondary | ICD-10-CM | POA: Diagnosis not present

## 2015-09-23 DIAGNOSIS — F17210 Nicotine dependence, cigarettes, uncomplicated: Secondary | ICD-10-CM | POA: Diagnosis not present

## 2015-09-23 DIAGNOSIS — Z8551 Personal history of malignant neoplasm of bladder: Secondary | ICD-10-CM | POA: Diagnosis not present

## 2015-09-23 DIAGNOSIS — I10 Essential (primary) hypertension: Secondary | ICD-10-CM | POA: Diagnosis not present

## 2015-09-23 DIAGNOSIS — J449 Chronic obstructive pulmonary disease, unspecified: Secondary | ICD-10-CM | POA: Diagnosis not present

## 2015-09-23 DIAGNOSIS — Z8512 Personal history of malignant neoplasm of trachea: Secondary | ICD-10-CM | POA: Diagnosis not present

## 2015-09-23 DIAGNOSIS — I6522 Occlusion and stenosis of left carotid artery: Secondary | ICD-10-CM | POA: Diagnosis not present

## 2015-10-28 ENCOUNTER — Other Ambulatory Visit: Payer: Self-pay | Admitting: Internal Medicine

## 2015-10-28 DIAGNOSIS — E039 Hypothyroidism, unspecified: Secondary | ICD-10-CM | POA: Diagnosis not present

## 2015-10-28 DIAGNOSIS — G7000 Myasthenia gravis without (acute) exacerbation: Secondary | ICD-10-CM | POA: Diagnosis not present

## 2015-10-28 DIAGNOSIS — E7800 Pure hypercholesterolemia, unspecified: Secondary | ICD-10-CM | POA: Diagnosis not present

## 2015-10-28 DIAGNOSIS — Z23 Encounter for immunization: Secondary | ICD-10-CM | POA: Diagnosis not present

## 2015-10-28 DIAGNOSIS — M48061 Spinal stenosis, lumbar region without neurogenic claudication: Secondary | ICD-10-CM

## 2015-10-28 DIAGNOSIS — J449 Chronic obstructive pulmonary disease, unspecified: Secondary | ICD-10-CM | POA: Diagnosis not present

## 2015-10-28 DIAGNOSIS — Z720 Tobacco use: Secondary | ICD-10-CM | POA: Diagnosis not present

## 2015-10-28 DIAGNOSIS — I69359 Hemiplegia and hemiparesis following cerebral infarction affecting unspecified side: Secondary | ICD-10-CM | POA: Diagnosis not present

## 2015-10-28 DIAGNOSIS — I10 Essential (primary) hypertension: Secondary | ICD-10-CM | POA: Diagnosis not present

## 2015-10-28 DIAGNOSIS — M4806 Spinal stenosis, lumbar region: Secondary | ICD-10-CM | POA: Diagnosis not present

## 2015-10-28 DIAGNOSIS — M810 Age-related osteoporosis without current pathological fracture: Secondary | ICD-10-CM | POA: Diagnosis not present

## 2015-10-28 DIAGNOSIS — E538 Deficiency of other specified B group vitamins: Secondary | ICD-10-CM | POA: Diagnosis not present

## 2015-11-06 ENCOUNTER — Ambulatory Visit
Admission: RE | Admit: 2015-11-06 | Discharge: 2015-11-06 | Disposition: A | Discharge status: Home / Self Care | Payer: PPO | Source: Ambulatory Visit | Attending: Internal Medicine | Admitting: Internal Medicine

## 2015-11-06 DIAGNOSIS — M5117 Intervertebral disc disorders with radiculopathy, lumbosacral region: Secondary | ICD-10-CM | POA: Diagnosis not present

## 2015-11-06 DIAGNOSIS — M48061 Spinal stenosis, lumbar region without neurogenic claudication: Secondary | ICD-10-CM

## 2015-11-14 ENCOUNTER — Other Ambulatory Visit (HOSPITAL_COMMUNITY): Payer: Self-pay | Admitting: *Deleted

## 2015-11-17 ENCOUNTER — Encounter (HOSPITAL_COMMUNITY): Payer: Self-pay

## 2015-11-17 ENCOUNTER — Ambulatory Visit (HOSPITAL_COMMUNITY)
Admission: RE | Admit: 2015-11-17 | Discharge: 2015-11-17 | Disposition: A | Discharge status: Home / Self Care | Payer: PPO | Source: Ambulatory Visit | Attending: Internal Medicine | Admitting: Internal Medicine

## 2015-11-17 DIAGNOSIS — M810 Age-related osteoporosis without current pathological fracture: Secondary | ICD-10-CM | POA: Insufficient documentation

## 2015-11-17 MED ORDER — ZOLEDRONIC ACID 5 MG/100ML IV SOLN
5.0000 mg | Freq: Once | INTRAVENOUS | Status: AC
  Administered 2015-11-17: 5 mg via INTRAVENOUS

## 2015-11-17 MED ORDER — ZOLEDRONIC ACID 5 MG/100ML IV SOLN
INTRAVENOUS | Status: AC
  Administered 2015-11-17: 5 mg via INTRAVENOUS
  Filled 2015-11-17: qty 100

## 2015-11-17 NOTE — Discharge Instructions (Signed)

## 2015-11-26 DIAGNOSIS — M4727 Other spondylosis with radiculopathy, lumbosacral region: Secondary | ICD-10-CM | POA: Diagnosis not present

## 2015-12-08 DIAGNOSIS — M9983 Other biomechanical lesions of lumbar region: Secondary | ICD-10-CM | POA: Diagnosis not present

## 2015-12-08 DIAGNOSIS — I10 Essential (primary) hypertension: Secondary | ICD-10-CM | POA: Diagnosis not present

## 2015-12-08 DIAGNOSIS — M48062 Spinal stenosis, lumbar region with neurogenic claudication: Secondary | ICD-10-CM | POA: Diagnosis not present

## 2015-12-17 DIAGNOSIS — M9983 Other biomechanical lesions of lumbar region: Secondary | ICD-10-CM | POA: Diagnosis not present

## 2015-12-17 DIAGNOSIS — I10 Essential (primary) hypertension: Secondary | ICD-10-CM | POA: Diagnosis not present

## 2015-12-17 DIAGNOSIS — M48062 Spinal stenosis, lumbar region with neurogenic claudication: Secondary | ICD-10-CM | POA: Diagnosis not present

## 2015-12-22 ENCOUNTER — Ambulatory Visit (INDEPENDENT_AMBULATORY_CARE_PROVIDER_SITE_OTHER): Payer: PPO | Admitting: Neurology

## 2015-12-22 ENCOUNTER — Encounter: Payer: Self-pay | Admitting: Neurology

## 2015-12-22 VITALS — BP 100/68 | HR 65 | Ht 72.0 in | Wt 169.6 lb

## 2015-12-22 DIAGNOSIS — G7000 Myasthenia gravis without (acute) exacerbation: Secondary | ICD-10-CM | POA: Diagnosis not present

## 2015-12-22 NOTE — Patient Instructions (Addendum)
1.  Continue mestinon '60mg'$  1-2 tablets daily as needed 2.  Handicap placard given  Return to clinic in 6 months

## 2015-12-22 NOTE — Progress Notes (Signed)
Follow-up Visit   Date: 12/22/15    Chase Nguyen MRN: [id number] DOB: 09-15-1935   Interim History: Chase Nguyen is a 80 y.o. 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 About two weeks later, he developed droopiness of his eyes with diurnal variation. He denies double vision, problems with talking, or shortness of breath. His wife says that he has always tended to keep his neck flexed for several years, but it has worsened over the past several weeks. He has occasional problems with swallowing.   He saw Dr. Celesta Aver, opthalmology and recommended lab testing for myasthenia. His acetylcholine receptor binding antibody return positive (1.77, normal <0.24).  He was initially seen in the clinic on 03/13/2013 at which time mestinon 45m TID and prednisone 136mwas started.  He had significant improvement with ptosis and neck strength after taking mestinon.   In June 2015, he was found to have vertebral T12 compression fracture and severe osteopenia.    He was on a slow prednisone taper (65m63month) and successfully stopped corticosteorids in December 2016.    UPDATE 07/03/2015:  He is doing great off prednisone and has been complaint with mestinon 35m70mD.  No interval falls, but endorses imbalance and weakness of the right leg (old stroke).  He also complains of legs giving out and low back pain especially with prolonged walking and walking uphill.  If he rests, his low back pain is improved.   UPDATE 12/22/2015:   He is here  for 6 month follow-up appointment.  He remains asymptomatic with respect to his myasthenia gravis.  He self-tapered his mestinon 35mg59m1 tablet daily and remains off prednisone.  He is seeing Dr. DittyCyndy Freezehis low back pain and underwent ESI last week, without marked improvement.  Despite my recommendations, he continues to smoke, is not using a cane, and he is not doing his home exercises for right leg weakness.   He has not had any recent falls.   Medications:  Current Outpatient Prescriptions on File Prior to Visit  Medication Sig Dispense Refill  . amLODipine (NORVASC) 10 MG tablet Take 10 mg by mouth daily.     . aspMarland Kitchenrin 325 MG tablet Take 325 mg by mouth daily.    . benazepril-hydrochlorthiazide (LOTENSIN HCT) 20-12.5 MG per tablet Take 1 tablet by mouth daily.    . Cholecalciferol (VITAMIN D-3 PO) Take by mouth.    . levothyroxine (SYNTHROID, LEVOTHROID) 112 MCG tablet Take 112 mcg by mouth daily before breakfast.    . Multiple Vitamins-Minerals (PRESERVISION AREDS PO) Take 2 tablets by mouth daily.    . pravastatin (PRAVACHOL) 40 MG tablet Take 40 mg by mouth daily.      . pyrMarland Kitchendostigmine (MESTINON) 60 MG tablet Take 1 tablet (60 mg total) by mouth 3 (three) times daily. 90 tablet 11  . vitamin B-12 (CYANOCOBALAMIN) 100 MCG tablet Take 100 mcg by mouth daily.     No current facility-administered medications on file prior to visit.     Allergies: None  Review of Systems:  CONSTITUTIONAL: No fevers, chills, night sweats.  EYES: +visual changes or eye pain  ENT: +hearing changes. No history of nose bleeds.  RESPIRATORY: No cough, wheezing, shortness of breath.  CARDIOVASCULAR: Negative for chest pain, and palpitations.  GI: Negative for abdominal discomfort, blood in stools or black stools. No recent change in bowel habits.  GU: No history of incontinence.  MUSCLOSKELETAL: + history of joint pain or swelling. No myalgias.  SKIN: Negative for lesions, rash, and itching.    HEMATOLOGY/ONCOLOGY: Negative for prolonged bleeding, bruising easily, and swollen nodes. + history of cancer.  ENDOCRINE: No cold or heat intolerance, polydipsia or goiter.  PSYCH: No depression or anxiety symptoms.  NEURO: As Above.   Vital Signs:  BP 100/68   Pulse 65   Ht 6' (1.829 m)   Wt 169 lb 9 oz (76.9 kg)   SpO2 91%   BMI 23.00 kg/m   Neurological Exam: MENTAL STATUS including orientation to time, place, person is normal.  Speech is mildly dysarthric (old).  CRANIAL NERVES:  Pupils equal round and reactive to light.  Normal conjugate, extra-ocular eye movements in all directions of gaze.  No ptosis with sustained upward gaze.   Face is symmetric.  Frontalis, oribicularis oculi, orbicularis oris is 5/5. Buccinator is 5-/5 (stable).   Tongue is midline with normal strength.  MOTOR:  Motor strength of upper extremities is 5/5 bilaterally. There is very slight increase in tone in the right upper extremity. Residual weakness of the right lower extremity from old stroke, RLE is 3/5.  LLE is 5/5  COORDINATION/GAIT: Gait shows stooped posture, he is dragging the right leg and supporting the right hand with his left.  Stable.   Data: Labs 03/05/2013: AChR binding 1.77*, CRP 3.6, ESR 6  MRI lumbar spine wo contrast 07/17/2013: 1. Acute to subacute T12 compression fracture with mild loss of height. No retropulsion of bone. A benign osteoporotic fracture is  favored over pathologic fracture (e.g. due to T12 metastasis in this setting).  2. Meet marrow edema in the lower lumbar spine including at L5-S1 is favored to be degenerative in nature. No definite lumbar metastatic disease.  3. Chronically advanced lumbar spine degeneration, stable since 2011 except at L5-S1 where bilateral lateral recess stenosis appears progressed.   IMPRESSION/PLAN: 1. Generalized myasthenia gravis, AChR binding positive, thymoma negative (dx 03/2013), clinically stable  - He has remained stable and has  been off prednisone since December 2016  - If there is relapse, start prednisone 47m (he did not require greater than 131mpreviously)  - Continue mestinon 6038m-2 tablet daily  2. Spinal stenosis with neurogenic claudication, chronic low back pain due to advanced degenerative changes of the lumbar spine and foraminal stenosis.  History of T12 compression fracture, severe osteopenia. Rec'd ESI last week.  Followed by Dr. DitCyndy Freeze3. History of stroke with residual right hemisensory loss and right leg weakness, carotid stenosis s/p L CEA (2008)   - Noncompliant with home physical exercises  - on ASA, statin, ACEi   - Strongly encouraged to use a cane; high a fall risk and patient has known osteopenia with already one vertebral   - Handicap placard form completed  4.  Tobacco use, COPD.  He is not interested in tobacco cessation  5. History of left AION with residual visual field deficits  Return to clinic 6 months   The duration of this appointment visit was 25 minutes of face-to-face time with the patient.  Greater than 50% of  this time was spent in counseling, explanation of diagnosis, planning of further management, and coordination of care.   Thank you for allowing me to participate in patient's care.  If I can answer any additional questions, I would be pleased to do so.    Sincerely,    Lelah Rennaker K. Posey Pronto, DO

## 2015-12-23 ENCOUNTER — Ambulatory Visit: Payer: PPO | Admitting: Neurology

## 2015-12-31 DIAGNOSIS — E039 Hypothyroidism, unspecified: Secondary | ICD-10-CM | POA: Diagnosis not present

## 2015-12-31 DIAGNOSIS — M48062 Spinal stenosis, lumbar region with neurogenic claudication: Secondary | ICD-10-CM | POA: Diagnosis not present

## 2016-01-02 ENCOUNTER — Ambulatory Visit: Payer: PPO | Admitting: Neurology

## 2016-01-21 DIAGNOSIS — M9983 Other biomechanical lesions of lumbar region: Secondary | ICD-10-CM | POA: Diagnosis not present

## 2016-01-21 DIAGNOSIS — M48062 Spinal stenosis, lumbar region with neurogenic claudication: Secondary | ICD-10-CM | POA: Diagnosis not present

## 2016-02-04 ENCOUNTER — Encounter (HOSPITAL_COMMUNITY): Payer: PPO

## 2016-02-04 ENCOUNTER — Ambulatory Visit: Payer: PPO | Admitting: Family

## 2016-04-28 DIAGNOSIS — M810 Age-related osteoporosis without current pathological fracture: Secondary | ICD-10-CM | POA: Diagnosis not present

## 2016-04-28 DIAGNOSIS — G7000 Myasthenia gravis without (acute) exacerbation: Secondary | ICD-10-CM | POA: Diagnosis not present

## 2016-04-28 DIAGNOSIS — Z0000 Encounter for general adult medical examination without abnormal findings: Secondary | ICD-10-CM | POA: Diagnosis not present

## 2016-04-28 DIAGNOSIS — I10 Essential (primary) hypertension: Secondary | ICD-10-CM | POA: Diagnosis not present

## 2016-04-28 DIAGNOSIS — Z1389 Encounter for screening for other disorder: Secondary | ICD-10-CM | POA: Diagnosis not present

## 2016-04-28 DIAGNOSIS — E7800 Pure hypercholesterolemia, unspecified: Secondary | ICD-10-CM | POA: Diagnosis not present

## 2016-04-28 DIAGNOSIS — E063 Autoimmune thyroiditis: Secondary | ICD-10-CM | POA: Diagnosis not present

## 2016-04-28 DIAGNOSIS — E038 Other specified hypothyroidism: Secondary | ICD-10-CM | POA: Diagnosis not present

## 2016-04-28 DIAGNOSIS — J449 Chronic obstructive pulmonary disease, unspecified: Secondary | ICD-10-CM | POA: Diagnosis not present

## 2016-04-28 DIAGNOSIS — I739 Peripheral vascular disease, unspecified: Secondary | ICD-10-CM | POA: Diagnosis not present

## 2016-05-28 DIAGNOSIS — H47012 Ischemic optic neuropathy, left eye: Secondary | ICD-10-CM | POA: Diagnosis not present

## 2016-05-28 DIAGNOSIS — H524 Presbyopia: Secondary | ICD-10-CM | POA: Diagnosis not present

## 2016-05-28 DIAGNOSIS — H5213 Myopia, bilateral: Secondary | ICD-10-CM | POA: Diagnosis not present

## 2016-05-28 DIAGNOSIS — H353131 Nonexudative age-related macular degeneration, bilateral, early dry stage: Secondary | ICD-10-CM | POA: Diagnosis not present

## 2016-05-28 DIAGNOSIS — H52223 Regular astigmatism, bilateral: Secondary | ICD-10-CM | POA: Diagnosis not present

## 2016-06-09 DIAGNOSIS — E7800 Pure hypercholesterolemia, unspecified: Secondary | ICD-10-CM | POA: Diagnosis not present

## 2016-06-09 DIAGNOSIS — I10 Essential (primary) hypertension: Secondary | ICD-10-CM | POA: Diagnosis not present

## 2016-06-09 DIAGNOSIS — E038 Other specified hypothyroidism: Secondary | ICD-10-CM | POA: Diagnosis not present

## 2016-06-21 ENCOUNTER — Encounter: Payer: Self-pay | Admitting: Neurology

## 2016-06-21 ENCOUNTER — Ambulatory Visit (INDEPENDENT_AMBULATORY_CARE_PROVIDER_SITE_OTHER): Payer: PPO | Admitting: Neurology

## 2016-06-21 VITALS — BP 130/80 | HR 73 | Ht 72.0 in | Wt 167.2 lb

## 2016-06-21 DIAGNOSIS — G7000 Myasthenia gravis without (acute) exacerbation: Secondary | ICD-10-CM | POA: Diagnosis not present

## 2016-06-21 DIAGNOSIS — M48062 Spinal stenosis, lumbar region with neurogenic claudication: Secondary | ICD-10-CM

## 2016-06-21 MED ORDER — PYRIDOSTIGMINE BROMIDE 60 MG PO TABS: 60.0000 mg | ORAL_TABLET | Freq: Two times a day (BID) | ORAL | 5 refills | Status: DC | PRN

## 2016-06-21 NOTE — Patient Instructions (Addendum)
If you develop any double vision, droopy eyelids, difficulty swallowing/talking, or weakness, you can take pyridostigmine (mestinon) '60mg'$  twice daily as needed  Call my office if you develop any new symptoms.  Recommend using a cane and stop smoking  Return to clinic in 9 months

## 2016-06-21 NOTE — Progress Notes (Signed)
Follow-up Visit   Date: 06/21/16    Chase Nguyen MRN: [id number] DOB: 1935-10-10   Interim History: Chase Nguyen is a 81 y.o. 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 About two weeks later, he developed droopiness of his eyes with diurnal variation. He denies double vision, problems with talking, or shortness of breath. His wife says that he has always tended to keep his neck flexed for several years, but it has worsened over the past several weeks. He has occasional problems with swallowing.   He saw Dr. Celesta Aver, opthalmology and recommended lab testing for myasthenia. His acetylcholine receptor binding antibody return positive (1.77, normal <0.24).  He was initially seen in the clinic on 03/13/2013 at which time mestinon 89m TID and prednisone 127mwas started.  He had significant improvement with ptosis and neck strength after taking mestinon.   In June 2015, he was found to have vertebral T12 compression fracture and severe osteopenia.    He was on a slow prednisone taper (58m58month) and successfully stopped corticosteorids in December 2016.  He was doing well on mestinon 52m34mD and was able to taper this to only as needed.  He did not have any breakthrough MG-symptoms.  UPDATE 12/22/2015:   He is here for 6 month follow-up appointment.  He remains asymptomatic with respect to his myasthenia gravis.  He self-tapered his mestinon 52mg17m1 tablet daily and remains off prednisone.  He is seeing Dr. DittyCyndy Freezehis  low back pain and underwent ESI last week, without marked improvement.    UPDATE 06/21/2016:  He is here for 6-mon20-monthw-up visit.  He is doing well and has no new complaints.  He has self-discontinued his mestinon and denies any double vision, generalized weakness, of facial weakness. He suffered one fall while turning too quickly and did not sustain any injuries.  He continues to was unassisted, despite my recommendation to use a cane/walker.    Medications:  Current Outpatient Prescriptions on File Prior to Visit  Medication Sig Dispense Refill  . amLODipine (NORVASC) 10 MG tablet Take 10 mg by mouth daily.     . aspiMarland Kitchenin 325 MG tablet Take 325 mg by mouth daily.    . benazepril-hydrochlorthiazide (LOTENSIN HCT) 20-12.5 MG per tablet Take 1 tablet by mouth daily.    . Cholecalciferol (VITAMIN D-3 PO) Take by mouth.    . diclofenac (VOLTAREN) 75 MG EC tablet     . gabapentin (NEURONTIN) 300 MG capsule     . levothyroxine (SYNTHROID, LEVOTHROID) 112 MCG tablet Take 112 mcg by mouth daily before breakfast.    . Multiple Vitamins-Minerals (PRESERVISION AREDS PO) Take 2 tablets by mouth daily.    . pravastatin (PRAVACHOL) 40 MG tablet Take 40 mg by mouth daily.      . vitamin B-12 (CYANOCOBALAMIN) 100 MCG tablet Take 100 mcg by mouth daily.     No current facility-administered medications on file prior to visit.     Allergies: None  Review of Systems:  CONSTITUTIONAL: No fevers, chills, night  sweats.  EYES: +visual changes or eye pain  ENT: +hearing changes. No history of nose bleeds.  RESPIRATORY: No cough, wheezing, shortness of breath.  CARDIOVASCULAR: Negative for chest pain, and palpitations.  GI: Negative for abdominal discomfort, blood in stools or black stools. No recent change in bowel habits.  GU: No history of incontinence.  MUSCLOSKELETAL: + history of joint pain or swelling. No myalgias.  SKIN: Negative for lesions, rash, and itching.  HEMATOLOGY/ONCOLOGY: Negative for  prolonged bleeding, bruising easily, and swollen nodes. + history of cancer.  ENDOCRINE: No cold or heat intolerance, polydipsia or goiter.  PSYCH: No depression or anxiety symptoms.  NEURO: As Above.   Vital Signs:  BP 130/80   Pulse 73   Ht 6' (1.829 m)   Wt 167 lb 3 oz (75.8 kg)   SpO2 92%   BMI 22.67 kg/m   Neurological Exam: MENTAL STATUS including orientation to time, place, person is normal.  Speech is mildly dysarthric (old).  CRANIAL NERVES:  Pupils are small, equal round and reactive to light.  Normal conjugate, extra-ocular eye movements in all directions of gaze.  No ptosis with sustained upward gaze.   Face is symmetric.  Facial muscles are 5/5.    Tongue is midline with normal strength.  MOTOR:  Motor strength of upper extremities is 5/5 bilaterally, except 4+/5 with shoulder abduction in the right, biceps and triceps is 5/5 - he attributes this weakness to new shoulder pain. There is very slight increase in tone in the right upper extremity. Residual weakness of the right lower extremity from old stroke, RLE is 3/5.  LLE is 5/5  COORDINATION/GAIT: Gait shows stooped posture, he is dragging the right leg and supporting the right hand with his left.  Stable.   Data: Labs 03/05/2013: AChR binding 1.77*, CRP 3.6, ESR 6  MRI lumbar spine wo contrast 07/17/2013: 1. Acute to subacute T12 compression fracture with mild loss of height. No retropulsion of bone. A benign osteoporotic fracture is  favored over pathologic fracture (e.g. due to T12 metastasis in this setting).  2. Meet marrow edema in the lower lumbar spine including at L5-S1 is favored to be degenerative in nature. No definite lumbar metastatic disease.  3. Chronically advanced lumbar spine degeneration, stable since 2011 except at L5-S1 where bilateral lateral recess stenosis appears progressed.   IMPRESSION/PLAN: 1. Generalized myasthenia gravis, AChR binding positive, thymoma negative (dx 03/2013), clinically  stable  - He has remained stable and has been off prednisone since December 2016, off mestinon since early 2018  - If there is relapse, start prednisone 20m (he did not require greater than 163mpreviously)  - Continue mestinon 6035m-2 tablet as needed only  2. Spinal stenosis with neurogenic claudication, chronic low back pain due to advanced degenerative changes of the lumbar spine and foraminal stenosis.  History of T12 compression fracture, severe osteopenia. Rec'd ESI last week.  Followed by Dr. DitCyndy Freeze3. History of stroke with residual right hemisensory loss and right leg weakness, carotid stenosis s/p L CEA (2008)   - on ASA, statin, ACEi   - Strongly encouraged to use a cane; high a fall risk and patient has known osteopenia with history of vertebral fracture   4.  Tobacco use, COPD.  He is not interested in tobacco cessation  5. History of left AION with residual visual field deficits  Return to clinic 9 months   The duration of this appointment visit was 15 minutes of face-to-face time with  the patient.  Greater than 50% of this time was spent in counseling, explanation of diagnosis, planning of further management, and coordination of care.   Thank you for allowing me to participate in patient's care.  If I can answer any additional questions, I would be pleased to do so.    Sincerely,    Rayansh Herbst K. Posey Pronto, DO

## 2016-07-16 ENCOUNTER — Encounter: Payer: Self-pay | Admitting: Family

## 2016-07-22 ENCOUNTER — Other Ambulatory Visit: Payer: Self-pay

## 2016-07-22 DIAGNOSIS — I6529 Occlusion and stenosis of unspecified carotid artery: Secondary | ICD-10-CM

## 2016-07-28 ENCOUNTER — Ambulatory Visit (INDEPENDENT_AMBULATORY_CARE_PROVIDER_SITE_OTHER): Payer: PPO | Admitting: Family

## 2016-07-28 ENCOUNTER — Ambulatory Visit (HOSPITAL_COMMUNITY)
Admission: RE | Admit: 2016-07-28 | Discharge: 2016-07-28 | Disposition: A | Discharge status: Home / Self Care | Payer: PPO | Source: Ambulatory Visit | Attending: Vascular Surgery | Admitting: Vascular Surgery

## 2016-07-28 ENCOUNTER — Encounter: Payer: Self-pay | Admitting: Family

## 2016-07-28 VITALS — BP 132/55 | HR 50 | Temp 96.8°F | Resp 18 | Ht 68.0 in | Wt 160.0 lb

## 2016-07-28 DIAGNOSIS — Z87891 Personal history of nicotine dependence: Secondary | ICD-10-CM | POA: Diagnosis not present

## 2016-07-28 DIAGNOSIS — I6523 Occlusion and stenosis of bilateral carotid arteries: Secondary | ICD-10-CM | POA: Insufficient documentation

## 2016-07-28 DIAGNOSIS — Z9889 Other specified postprocedural states: Secondary | ICD-10-CM | POA: Insufficient documentation

## 2016-07-28 DIAGNOSIS — I6529 Occlusion and stenosis of unspecified carotid artery: Secondary | ICD-10-CM

## 2016-07-28 DIAGNOSIS — I6521 Occlusion and stenosis of right carotid artery: Secondary | ICD-10-CM

## 2016-07-28 DIAGNOSIS — Z720 Tobacco use: Secondary | ICD-10-CM | POA: Diagnosis not present

## 2016-07-28 DIAGNOSIS — R0989 Other specified symptoms and signs involving the circulatory and respiratory systems: Secondary | ICD-10-CM

## 2016-07-28 DIAGNOSIS — I6522 Occlusion and stenosis of left carotid artery: Secondary | ICD-10-CM | POA: Diagnosis not present

## 2016-07-28 LAB — VAS US CAROTID
LCCADDIAS: 11 cm/s
LCCAPSYS: 160 cm/s
LEFT ECA DIAS: -226 cm/s
LICAPDIAS: -19 cm/s
Left CCA dist sys: 183 cm/s
Left ICA dist dias: -12 cm/s
Left ICA dist sys: -87 cm/s
Left ICA prox sys: -165 cm/s
RIGHT VERTEBRAL DIAS: 10 cm/s

## 2016-07-28 NOTE — Progress Notes (Signed)
Chief Complaint: Follow up Extracranial Carotid Artery Stenosis   History of Present Illness  Chase Nguyen is a 81 y.o. male patient of Dr. Donnetta Hutching who is status post left CEA in October 2008. He has known occlusion of the right common and internal carotid arteries. He returns today for follow up. He had a stroke, as manifested by loss of vision in his left eye, right leg weakness, no right arm weakness, just prior to the 2008 left CEA. He continues to have loss of vision in his left eye, right leg weakness. Wife states he drags his right leg.  He states he has fallen on rare occassions.  He denies aphasia.  He has had no further known stroke or TIA symptoms.  He denies tingling, numbness, pain, or cold feeling in either upper extremity.  He denies claudication symptoms with walking, denies non healing wounds.  He is received physical therapy for strengthening, requested by his neurologist, Dr. Posey Pronto.  He has inflammation in his back.  He is active with gardening.  Pt Diabetic: No Pt smoker: resumed smoking from Feb, 2015 when he quit for 8 months; states he stopped smoking on 07-12-16, is now chewing tobacco  Pt meds include: Statin : Yes ASA: Yes Other anticoagulants/antiplatelets: no   Past Medical History:  Diagnosis Date  . Bilateral cataracts   . Cancer of lower lobe of lung ([place]) 04/05/2011  . COPD (chronic obstructive pulmonary disease) ([place])   . Degenerative arthritis of spine    AND HIPS  . Emphysema of lung ([place])   . Family hx of lung cancer   . HTN (hypertension)   . Hx of bladder cancer   . Hyperlipemia   . Internal carotid artery occlusion   . Left inguinal hernia   . Obstructive airway disease ([place])   . Skin cancer    RIGHT EAR AND BACK  . 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  . Stroke ([place])   . Tobacco abuse     Social History Social History  Substance Use  Topics  . Smoking status: Current Every Day Smoker    Packs/day: 1.00    Years: 71.00    Types: Cigarettes  . Smokeless tobacco: Former Systems developer  . Alcohol use No    Family History Family History  Problem Relation Age of Onset  . Heart attack Father   . Hypertension Father   . Heart disease Father   . Varicose Veins Father   . Cancer Mother   . Diabetes Brother   . Cancer Sister   . Stroke Sister     Surgical History Past Surgical History:  Procedure Laterality Date  . CATARACT EXTRACTION    . cystoscopy,transurethral resection of bladder  12/22/1999  . EYE SURGERY    . fiberoptic bronch,bx of bronchial stem,bx of distal tracheal lesion,laser of distal tracheal lesion  06/02/1999  . FLEXIBLE BRONCHOSCOPY W/ LASER  05/22/1998,07/06/1999,09/21/1999,04/07/2000  . L VATS  11/ 30/ 1999  . LEFT LOWER LOBECTOMY  05/09/1998  . MYRINGOTOMY  07/06/1999   with left tube placement  . right canal wall down tympanoplasty/mastoidectomy,,myringotomy  08/23/2001  . TYMPANOPLASTY  07/11/1998   WITH OSSICULOPLASTY/LEFT  . VIDEO BRONCHOSCOPY  11/02/1998    No Known Allergies  Current Outpatient Prescriptions  Medication Sig Dispense Refill  . amLODipine (NORVASC) 10 MG tablet Take 10 mg by mouth daily.     Marland Kitchen aspirin 325 MG tablet Take  325 mg by mouth daily.    . benazepril-hydrochlorthiazide (LOTENSIN HCT) 20-12.5 MG per tablet Take 1 tablet by mouth daily.    . Cholecalciferol (VITAMIN D-3 PO) Take by mouth.    . diclofenac (VOLTAREN) 75 MG EC tablet     . gabapentin (NEURONTIN) 300 MG capsule     . levothyroxine (SYNTHROID, LEVOTHROID) 112 MCG tablet Take 112 mcg by mouth daily before breakfast.    . Multiple Vitamins-Minerals (PRESERVISION AREDS PO) Take 2 tablets by mouth daily.    . pravastatin (PRAVACHOL) 40 MG tablet Take 40 mg by mouth daily.      . vitamin B-12 (CYANOCOBALAMIN) 100 MCG tablet Take 100 mcg by mouth daily.    Marland Kitchen pyridostigmine (MESTINON) 60 MG tablet Take 1 tablet  (60 mg total) by mouth 2 (two) times daily as needed (weakness, droopy eyelids, double vision). (Patient not taking: Reported on 07/28/2016) 60 tablet 5   No current facility-administered medications for this visit.     Review of Systems : See HPI for pertinent positives and negatives.  Physical Examination  Vitals:   07/28/16 1047 07/28/16 1051  BP: 131/74 (!) 132/55  Pulse: (!) 50 (!) 50  Resp: 18   Temp: (!) 96.8 F (36 C)   TempSrc: Oral   SpO2: 98%   Weight: 160 lb (72.6 kg)   Height: 5\' 8"  (1.727 m)    Body mass index is 24.33 kg/m.  General: WDWN elderly male in NAD GAIT: slow and deliberate, drags his right leg slightly.  Eyes: PERRLA Pulmonary: Respirations are non-labored, CTAB, fair air movement Cardiac: Regular rhythm, + murmur.  VASCULAR EXAM Carotid Bruits Left Right   positive Positive    Abdominal aortic pulse is not palpable. Radial pulses: right is 2+, left is 2+ palpable.  Bilateral femoral pulses are palpable. Bilateral DP pulses are faintly palpable, bilateral PT pulses are not palpable.     Gastrointestinal: soft, nontender, BS WNL, no r/g, no palpable masses.  Musculoskeletal: No muscle atrophy/wasting. M/S 4/5 throughout except 3/5 in right LE, extremities without ischemic changes. trace pitting edema in right ankle. Mild erythema at right lower pretibial area, no ulcers.   Neurologic: A&O X 3; appropriate affect, speech is normal, CN 2-12 intact except is hard of hearing (hearing aid in place), Pain and light touch intact in extremities, Motor exam as listed above    Assessment: Chase Nguyen is a 81 y.o. male who is status post left CEA in October 2008. He has known occlusion of the right common and internal carotid arteries. He had a stroke, as manifested by loss of vision in his left eye, left leg  weakness, no left arm weakness, just before the 2008 left CEA. He continues to have loss of vision in his left eye, left leg weakness. He denies aphasia. He has had no further known stroke or TIA symptoms.    He has decreased pedal pulses, no signs of ischemia in his feet or legs. He does have mild erythema at right lower leg.  Fortunately he does not have DM, but unfortunately he continues to smokeless tobacco; see Plan.  Today's carotid Duplex suggests left carotid endarterectomy site with Doppler velocities suggestive of 1-39% stenosis left internal carotid artery. Known right carotid artery occlusions on previous exams. No significant change in the left internal carotid artery velocities when compared to the previous exam on 07/17/2014.     Plan: The patient was counseled re smokeless tobacco cessation and given several free resources  re smoking cessation.  Daily seated leg exercises discussed and demonstrated.     Follow-up in 1 year with Carotid Duplex scan and ABI's  I discussed in depth with the patient the nature of atherosclerosis, and emphasized the importance of maximal medical management including strict control of blood pressure, blood glucose, and lipid levels, obtaining regular exercise, and cessation of smoking.  The patient is aware that without maximal medical management the underlying atherosclerotic disease process will progress, limiting the benefit of any interventions. The patient was given information about stroke prevention and what symptoms should prompt the patient to seek immediate medical care. Thank you for allowing [place] to participate in this patient's care.  Clemon Chambers, RN, MSN, FNP-C [facility] Office: [phone]  Clinic Physician: Scot Dock  07/28/16 11:02 AM

## 2016-07-28 NOTE — Patient Instructions (Signed)
Steps to Quit Smoking Smoking tobacco can be bad for your health. It can also affect almost every organ in your body. Smoking puts you and people around you at risk for many serious long-lasting (chronic) diseases. Quitting smoking is hard, but it is one of the best things that you can do for your health. It is never too late to quit. What are the benefits of quitting smoking? When you quit smoking, you lower your risk for getting serious diseases and conditions. They can include:  Lung cancer or lung disease.  Heart disease.  Stroke.  Heart attack.  Not being able to have children (infertility).  Weak bones (osteoporosis) and broken bones (fractures).  If you have coughing, wheezing, and shortness of breath, those symptoms may get better when you quit. You may also get sick less often. If you are pregnant, quitting smoking can help to lower your chances of having a baby of low birth weight. What can I do to help me quit smoking? Talk with your doctor about what can help you quit smoking. Some things you can do (strategies) include:  Quitting smoking totally, instead of slowly cutting back how much you smoke over a period of time.  Going to in-person counseling. You are more likely to quit if you go to many counseling sessions.  Using resources and support systems, such as: ? Online chats with a counselor. ? Phone quitlines. ? Printed self-help materials. ? Support groups or group counseling. ? Text messaging programs. ? Mobile phone apps or applications.  Taking medicines. Some of these medicines may have nicotine in them. If you are pregnant or breastfeeding, do not take any medicines to quit smoking unless your doctor says it is okay. Talk with your doctor about counseling or other things that can help you.  Talk with your doctor about using more than one strategy at the same time, such as taking medicines while you are also going to in-person counseling. This can help make  quitting easier. What things can I do to make it easier to quit? Quitting smoking might feel very hard at first, but there is a lot that you can do to make it easier. Take these steps:  Talk to your family and friends. Ask them to support and encourage you.  Call phone quitlines, reach out to support groups, or work with a counselor.  Ask people who smoke to not smoke around you.  Avoid places that make you want (trigger) to smoke, such as: ? Bars. ? Parties. ? Smoke-break areas at work.  Spend time with people who do not smoke.  Lower the stress in your life. Stress can make you want to smoke. Try these things to help your stress: ? Getting regular exercise. ? Deep-breathing exercises. ? Yoga. ? Meditating. ? Doing a body scan. To do this, close your eyes, focus on one area of your body at a time from head to toe, and notice which parts of your body are tense. Try to relax the muscles in those areas.  Download or buy apps on your mobile phone or tablet that can help you stick to your quit plan. There are many free apps, such as QuitGuide from the CDC (Centers for Disease Control and Prevention). You can find more support from smokefree.gov and other websites.  This information is not intended to replace advice given to you by your health care provider. Make sure you discuss any questions you have with your health care provider. Document Released: 11/14/2008 Document   Revised: 09/16/2015 Document Reviewed: 06/04/2014 Elsevier Interactive Patient Education  2018 Elsevier Inc.     Stroke Prevention Some medical conditions and behaviors are associated with an increased chance of having a stroke. You may prevent a stroke by making healthy choices and managing medical conditions. How can I reduce my risk of having a stroke?  Stay physically active. Get at least 30 minutes of activity on most or all days.  Do not smoke. It may also be helpful to avoid exposure to secondhand  smoke.  Limit alcohol use. Moderate alcohol use is considered to be: ? No more than 2 drinks per day for men. ? No more than 1 drink per day for nonpregnant women.  Eat healthy foods. This involves: ? Eating 5 or more servings of fruits and vegetables a day. ? Making dietary changes that address high blood pressure (hypertension), high cholesterol, diabetes, or obesity.  Manage your cholesterol levels. ? Making food choices that are high in fiber and low in saturated fat, trans fat, and cholesterol may control cholesterol levels. ? Take any prescribed medicines to control cholesterol as directed by your health care provider.  Manage your diabetes. ? Controlling your carbohydrate and sugar intake is recommended to manage diabetes. ? Take any prescribed medicines to control diabetes as directed by your health care provider.  Control your hypertension. ? Making food choices that are low in salt (sodium), saturated fat, trans fat, and cholesterol is recommended to manage hypertension. ? Ask your health care provider if you need treatment to lower your blood pressure. Take any prescribed medicines to control hypertension as directed by your health care provider. ? If you are 18-39 years of age, have your blood pressure checked every 3-5 years. If you are 40 years of age or older, have your blood pressure checked every year.  Maintain a healthy weight. ? Reducing calorie intake and making food choices that are low in sodium, saturated fat, trans fat, and cholesterol are recommended to manage weight.  Stop drug abuse.  Avoid taking birth control pills. ? Talk to your health care provider about the risks of taking birth control pills if you are over 35 years old, smoke, get migraines, or have ever had a blood clot.  Get evaluated for sleep disorders (sleep apnea). ? Talk to your health care provider about getting a sleep evaluation if you snore a lot or have excessive sleepiness.  Take  medicines only as directed by your health care provider. ? For some people, aspirin or blood thinners (anticoagulants) are helpful in reducing the risk of forming abnormal blood clots that can lead to stroke. If you have the irregular heart rhythm of atrial fibrillation, you should be on a blood thinner unless there is a good reason you cannot take them. ? Understand all your medicine instructions.  Make sure that other conditions (such as anemia or atherosclerosis) are addressed. Get help right away if:  You have sudden weakness or numbness of the face, arm, or leg, especially on one side of the body.  Your face or eyelid droops to one side.  You have sudden confusion.  You have trouble speaking (aphasia) or understanding.  You have sudden trouble seeing in one or both eyes.  You have sudden trouble walking.  You have dizziness.  You have a loss of balance or coordination.  You have a sudden, severe headache with no known cause.  You have new chest pain or an irregular heartbeat. Any of these symptoms may represent   a serious problem that is an emergency. Do not wait to see if the symptoms will go away. Get medical help at once. Call your local emergency services (911 in [place] Do not drive yourself to the hospital. This information is not intended to replace advice given to you by your health care provider. Make sure you discuss any questions you have with your health care provider. Document Released: 02/26/2004 Document Revised: 06/26/2015 Document Reviewed: 07/21/2012 Elsevier Interactive Patient Education  2017 Elsevier Inc.  

## 2016-08-09 DIAGNOSIS — M9901 Segmental and somatic dysfunction of cervical region: Secondary | ICD-10-CM | POA: Diagnosis not present

## 2016-08-09 DIAGNOSIS — M436 Torticollis: Secondary | ICD-10-CM | POA: Diagnosis not present

## 2016-08-09 DIAGNOSIS — M9903 Segmental and somatic dysfunction of lumbar region: Secondary | ICD-10-CM | POA: Diagnosis not present

## 2016-08-09 DIAGNOSIS — M5441 Lumbago with sciatica, right side: Secondary | ICD-10-CM | POA: Diagnosis not present

## 2016-08-13 DIAGNOSIS — M436 Torticollis: Secondary | ICD-10-CM | POA: Diagnosis not present

## 2016-08-13 DIAGNOSIS — M5441 Lumbago with sciatica, right side: Secondary | ICD-10-CM | POA: Diagnosis not present

## 2016-08-13 DIAGNOSIS — M9901 Segmental and somatic dysfunction of cervical region: Secondary | ICD-10-CM | POA: Diagnosis not present

## 2016-08-13 DIAGNOSIS — M9903 Segmental and somatic dysfunction of lumbar region: Secondary | ICD-10-CM | POA: Diagnosis not present

## 2016-08-31 NOTE — Addendum Note (Signed)
Addended by: Lianne Cure A on: 08/31/2016 05:08 PM   Modules accepted: Orders

## 2016-11-09 DIAGNOSIS — I10 Essential (primary) hypertension: Secondary | ICD-10-CM | POA: Diagnosis not present

## 2016-11-09 DIAGNOSIS — S22000A Wedge compression fracture of unspecified thoracic vertebra, initial encounter for closed fracture: Secondary | ICD-10-CM | POA: Diagnosis not present

## 2016-11-09 DIAGNOSIS — J449 Chronic obstructive pulmonary disease, unspecified: Secondary | ICD-10-CM | POA: Diagnosis not present

## 2016-11-09 DIAGNOSIS — Z720 Tobacco use: Secondary | ICD-10-CM | POA: Diagnosis not present

## 2016-11-09 DIAGNOSIS — I69359 Hemiplegia and hemiparesis following cerebral infarction affecting unspecified side: Secondary | ICD-10-CM | POA: Diagnosis not present

## 2016-11-09 DIAGNOSIS — E038 Other specified hypothyroidism: Secondary | ICD-10-CM | POA: Diagnosis not present

## 2016-11-09 DIAGNOSIS — Z23 Encounter for immunization: Secondary | ICD-10-CM | POA: Diagnosis not present

## 2016-11-09 DIAGNOSIS — M810 Age-related osteoporosis without current pathological fracture: Secondary | ICD-10-CM | POA: Diagnosis not present

## 2016-12-08 ENCOUNTER — Other Ambulatory Visit (HOSPITAL_COMMUNITY): Payer: Self-pay | Admitting: *Deleted

## 2016-12-09 ENCOUNTER — Ambulatory Visit (HOSPITAL_COMMUNITY)
Admission: RE | Admit: 2016-12-09 | Discharge: 2016-12-09 | Disposition: A | Discharge status: Home / Self Care | Payer: PPO | Source: Ambulatory Visit | Attending: Internal Medicine | Admitting: Internal Medicine

## 2016-12-09 DIAGNOSIS — M810 Age-related osteoporosis without current pathological fracture: Secondary | ICD-10-CM | POA: Insufficient documentation

## 2016-12-09 MED ORDER — ZOLEDRONIC ACID 5 MG/100ML IV SOLN: 5.0000 mg | Freq: Once | INTRAVENOUS | Status: DC

## 2016-12-09 MED ORDER — ZOLEDRONIC ACID 5 MG/100ML IV SOLN
INTRAVENOUS | Status: AC
  Administered 2016-12-09: 5 mg
  Filled 2016-12-09: qty 100

## 2016-12-09 NOTE — Discharge Instructions (Signed)

## 2017-01-31 ENCOUNTER — Ambulatory Visit
Admission: RE | Admit: 2017-01-31 | Discharge: 2017-01-31 | Disposition: A | Discharge status: Home / Self Care | Payer: PPO | Source: Ambulatory Visit | Attending: Nurse Practitioner | Admitting: Nurse Practitioner

## 2017-01-31 ENCOUNTER — Other Ambulatory Visit: Payer: Self-pay | Admitting: Nurse Practitioner

## 2017-01-31 DIAGNOSIS — Z720 Tobacco use: Secondary | ICD-10-CM | POA: Diagnosis not present

## 2017-01-31 DIAGNOSIS — J449 Chronic obstructive pulmonary disease, unspecified: Secondary | ICD-10-CM

## 2017-01-31 DIAGNOSIS — J441 Chronic obstructive pulmonary disease with (acute) exacerbation: Secondary | ICD-10-CM | POA: Diagnosis not present

## 2017-01-31 DIAGNOSIS — J9811 Atelectasis: Secondary | ICD-10-CM | POA: Diagnosis not present

## 2017-02-04 DIAGNOSIS — J449 Chronic obstructive pulmonary disease, unspecified: Secondary | ICD-10-CM | POA: Diagnosis not present

## 2017-02-04 DIAGNOSIS — Z720 Tobacco use: Secondary | ICD-10-CM | POA: Diagnosis not present

## 2017-03-03 DIAGNOSIS — J441 Chronic obstructive pulmonary disease with (acute) exacerbation: Secondary | ICD-10-CM | POA: Diagnosis not present

## 2017-03-25 ENCOUNTER — Encounter (HOSPITAL_COMMUNITY): Payer: Self-pay | Admitting: *Deleted

## 2017-03-25 ENCOUNTER — Other Ambulatory Visit: Payer: Self-pay

## 2017-03-25 ENCOUNTER — Ambulatory Visit: Payer: PPO | Admitting: Neurology

## 2017-03-25 ENCOUNTER — Emergency Department (HOSPITAL_COMMUNITY): Payer: PPO

## 2017-03-25 ENCOUNTER — Inpatient Hospital Stay (HOSPITAL_COMMUNITY)
Admission: EM | Admit: 2017-03-25 | Discharge: 2017-03-27 | DRG: 190 | Disposition: A | Discharge status: Hospice | Payer: PPO | Attending: Family Medicine | Admitting: Family Medicine

## 2017-03-25 ENCOUNTER — Encounter: Payer: Self-pay | Admitting: Neurology

## 2017-03-25 VITALS — BP 100/48 | HR 88 | Temp 98.4°F | Ht 68.0 in | Wt 139.1 lb

## 2017-03-25 DIAGNOSIS — R634 Abnormal weight loss: Secondary | ICD-10-CM

## 2017-03-25 DIAGNOSIS — R627 Adult failure to thrive: Secondary | ICD-10-CM | POA: Diagnosis not present

## 2017-03-25 DIAGNOSIS — Z801 Family history of malignant neoplasm of trachea, bronchus and lung: Secondary | ICD-10-CM

## 2017-03-25 DIAGNOSIS — E785 Hyperlipidemia, unspecified: Secondary | ICD-10-CM | POA: Diagnosis not present

## 2017-03-25 DIAGNOSIS — J9601 Acute respiratory failure with hypoxia: Secondary | ICD-10-CM | POA: Diagnosis present

## 2017-03-25 DIAGNOSIS — Z823 Family history of stroke: Secondary | ICD-10-CM

## 2017-03-25 DIAGNOSIS — Z515 Encounter for palliative care: Secondary | ICD-10-CM | POA: Diagnosis not present

## 2017-03-25 DIAGNOSIS — D638 Anemia in other chronic diseases classified elsewhere: Secondary | ICD-10-CM | POA: Diagnosis not present

## 2017-03-25 DIAGNOSIS — R64 Cachexia: Secondary | ICD-10-CM | POA: Diagnosis not present

## 2017-03-25 DIAGNOSIS — M479 Spondylosis, unspecified: Secondary | ICD-10-CM | POA: Diagnosis present

## 2017-03-25 DIAGNOSIS — I69341 Monoplegia of lower limb following cerebral infarction affecting right dominant side: Secondary | ICD-10-CM | POA: Diagnosis not present

## 2017-03-25 DIAGNOSIS — Z8512 Personal history of malignant neoplasm of trachea: Secondary | ICD-10-CM

## 2017-03-25 DIAGNOSIS — F17210 Nicotine dependence, cigarettes, uncomplicated: Secondary | ICD-10-CM | POA: Diagnosis present

## 2017-03-25 DIAGNOSIS — G7000 Myasthenia gravis without (acute) exacerbation: Secondary | ICD-10-CM | POA: Diagnosis present

## 2017-03-25 DIAGNOSIS — R5381 Other malaise: Secondary | ICD-10-CM | POA: Diagnosis not present

## 2017-03-25 DIAGNOSIS — E860 Dehydration: Secondary | ICD-10-CM | POA: Diagnosis present

## 2017-03-25 DIAGNOSIS — M4800 Spinal stenosis, site unspecified: Secondary | ICD-10-CM | POA: Diagnosis not present

## 2017-03-25 DIAGNOSIS — C7800 Secondary malignant neoplasm of unspecified lung: Secondary | ICD-10-CM | POA: Diagnosis not present

## 2017-03-25 DIAGNOSIS — Z9221 Personal history of antineoplastic chemotherapy: Secondary | ICD-10-CM

## 2017-03-25 DIAGNOSIS — I714 Abdominal aortic aneurysm, without rupture: Secondary | ICD-10-CM | POA: Diagnosis present

## 2017-03-25 DIAGNOSIS — C3492 Malignant neoplasm of unspecified part of left bronchus or lung: Secondary | ICD-10-CM | POA: Diagnosis not present

## 2017-03-25 DIAGNOSIS — Z8249 Family history of ischemic heart disease and other diseases of the circulatory system: Secondary | ICD-10-CM

## 2017-03-25 DIAGNOSIS — Z9849 Cataract extraction status, unspecified eye: Secondary | ICD-10-CM | POA: Diagnosis not present

## 2017-03-25 DIAGNOSIS — Z6821 Body mass index (BMI) 21.0-21.9, adult: Secondary | ICD-10-CM | POA: Diagnosis not present

## 2017-03-25 DIAGNOSIS — Z85828 Personal history of other malignant neoplasm of skin: Secondary | ICD-10-CM

## 2017-03-25 DIAGNOSIS — J45901 Unspecified asthma with (acute) exacerbation: Secondary | ICD-10-CM | POA: Diagnosis not present

## 2017-03-25 DIAGNOSIS — J441 Chronic obstructive pulmonary disease with (acute) exacerbation: Principal | ICD-10-CM | POA: Diagnosis present

## 2017-03-25 DIAGNOSIS — C3430 Malignant neoplasm of lower lobe, unspecified bronchus or lung: Secondary | ICD-10-CM | POA: Diagnosis present

## 2017-03-25 DIAGNOSIS — I700 Atherosclerosis of aorta: Secondary | ICD-10-CM | POA: Diagnosis not present

## 2017-03-25 DIAGNOSIS — R531 Weakness: Secondary | ICD-10-CM | POA: Diagnosis not present

## 2017-03-25 DIAGNOSIS — Z79890 Hormone replacement therapy (postmenopausal): Secondary | ICD-10-CM

## 2017-03-25 DIAGNOSIS — J439 Emphysema, unspecified: Secondary | ICD-10-CM | POA: Diagnosis not present

## 2017-03-25 DIAGNOSIS — L89152 Pressure ulcer of sacral region, stage 2: Secondary | ICD-10-CM | POA: Diagnosis not present

## 2017-03-25 DIAGNOSIS — M8448XA Pathological fracture, other site, initial encounter for fracture: Secondary | ICD-10-CM | POA: Diagnosis not present

## 2017-03-25 DIAGNOSIS — Z66 Do not resuscitate: Secondary | ICD-10-CM | POA: Diagnosis not present

## 2017-03-25 DIAGNOSIS — R05 Cough: Secondary | ICD-10-CM | POA: Diagnosis not present

## 2017-03-25 DIAGNOSIS — C3490 Malignant neoplasm of unspecified part of unspecified bronchus or lung: Secondary | ICD-10-CM

## 2017-03-25 DIAGNOSIS — L8990 Pressure ulcer of unspecified site, unspecified stage: Secondary | ICD-10-CM

## 2017-03-25 DIAGNOSIS — C3491 Malignant neoplasm of unspecified part of right bronchus or lung: Secondary | ICD-10-CM | POA: Diagnosis present

## 2017-03-25 DIAGNOSIS — Z79899 Other long term (current) drug therapy: Secondary | ICD-10-CM

## 2017-03-25 DIAGNOSIS — I10 Essential (primary) hypertension: Secondary | ICD-10-CM | POA: Diagnosis not present

## 2017-03-25 DIAGNOSIS — M8458XA Pathological fracture in neoplastic disease, other specified site, initial encounter for fracture: Secondary | ICD-10-CM | POA: Diagnosis not present

## 2017-03-25 DIAGNOSIS — Z902 Acquired absence of lung [part of]: Secondary | ICD-10-CM

## 2017-03-25 DIAGNOSIS — M160 Bilateral primary osteoarthritis of hip: Secondary | ICD-10-CM | POA: Diagnosis present

## 2017-03-25 DIAGNOSIS — J989 Respiratory disorder, unspecified: Secondary | ICD-10-CM | POA: Diagnosis not present

## 2017-03-25 DIAGNOSIS — C33 Malignant neoplasm of trachea: Secondary | ICD-10-CM | POA: Diagnosis present

## 2017-03-25 DIAGNOSIS — Z720 Tobacco use: Secondary | ICD-10-CM | POA: Diagnosis not present

## 2017-03-25 DIAGNOSIS — Z7982 Long term (current) use of aspirin: Secondary | ICD-10-CM

## 2017-03-25 DIAGNOSIS — R0602 Shortness of breath: Secondary | ICD-10-CM | POA: Diagnosis present

## 2017-03-25 DIAGNOSIS — IMO0002 Reserved for concepts with insufficient information to code with codable children: Secondary | ICD-10-CM

## 2017-03-25 DIAGNOSIS — Z8551 Personal history of malignant neoplasm of bladder: Secondary | ICD-10-CM

## 2017-03-25 HISTORY — DX: Atherosclerosis of aorta: I70.0

## 2017-03-25 LAB — URINALYSIS, ROUTINE W REFLEX MICROSCOPIC
Bilirubin Urine: NEGATIVE
GLUCOSE, UA: NEGATIVE mg/dL
KETONES UR: NEGATIVE mg/dL
LEUKOCYTES UA: NEGATIVE
Nitrite: NEGATIVE
PH: 5 (ref 5.0–8.0)
Protein, ur: NEGATIVE mg/dL

## 2017-03-25 LAB — BASIC METABOLIC PANEL
Anion gap: 11 (ref 5–15)
BUN: 33 mg/dL — AB (ref 6–20)
CHLORIDE: 102 mmol/L (ref 101–111)
CO2: 22 mmol/L (ref 22–32)
CREATININE: 1.3 mg/dL — AB (ref 0.61–1.24)
Calcium: 8.7 mg/dL — ABNORMAL LOW (ref 8.9–10.3)
GFR calc Af Amer: 59 mL/min — ABNORMAL LOW (ref >60)
GFR calc non Af Amer: 51 mL/min — ABNORMAL LOW (ref >60)
GLUCOSE: 109 mg/dL — AB (ref 65–99)
POTASSIUM: 4.3 mmol/L (ref 3.5–5.1)
SODIUM: 135 mmol/L (ref 135–145)

## 2017-03-25 LAB — CBC
HEMATOCRIT: 34.3 % — AB (ref 39.0–52.0)
Hemoglobin: 11.1 g/dL — ABNORMAL LOW (ref 13.0–17.0)
MCH: 30.7 pg (ref 26.0–34.0)
MCHC: 32.4 g/dL (ref 30.0–36.0)
MCV: 94.8 fL (ref 78.0–100.0)
PLATELETS: 199 10*3/uL (ref 150–400)
RBC: 3.62 MIL/uL — ABNORMAL LOW (ref 4.22–5.81)
RDW: 13.9 % (ref 11.5–15.5)
WBC: 8.8 10*3/uL (ref 4.0–10.5)

## 2017-03-25 LAB — TSH: TSH: 0.991 u[IU]/mL (ref 0.350–4.500)

## 2017-03-25 MED ORDER — ONDANSETRON HCL 4 MG PO TABS: 4.0000 mg | ORAL_TABLET | Freq: Four times a day (QID) | ORAL | Status: DC | PRN

## 2017-03-25 MED ORDER — ALBUTEROL SULFATE (2.5 MG/3ML) 0.083% IN NEBU: 2.5000 mg | INHALATION_SOLUTION | RESPIRATORY_TRACT | Status: DC | PRN

## 2017-03-25 MED ORDER — DOCUSATE SODIUM 100 MG PO CAPS
100.0000 mg | ORAL_CAPSULE | Freq: Two times a day (BID) | ORAL | Status: DC
  Administered 2017-03-25 – 2017-03-27 (×4): 100 mg via ORAL
  Filled 2017-03-25 (×4): qty 1

## 2017-03-25 MED ORDER — ENSURE ENLIVE PO LIQD
237.0000 mL | Freq: Two times a day (BID) | ORAL | Status: DC
  Administered 2017-03-26 – 2017-03-27 (×2): 237 mL via ORAL

## 2017-03-25 MED ORDER — ONDANSETRON HCL 4 MG/2ML IJ SOLN: 4.0000 mg | Freq: Four times a day (QID) | INTRAMUSCULAR | Status: DC | PRN

## 2017-03-25 MED ORDER — SODIUM CHLORIDE 0.9% FLUSH: 3.0000 mL | INTRAVENOUS | Status: DC | PRN

## 2017-03-25 MED ORDER — MORPHINE SULFATE (PF) 4 MG/ML IV SOLN: 2.0000 mg | INTRAVENOUS | Status: DC | PRN

## 2017-03-25 MED ORDER — ENOXAPARIN SODIUM 40 MG/0.4ML ~~LOC~~ SOLN
40.0000 mg | SUBCUTANEOUS | Status: DC
  Administered 2017-03-25: 40 mg via SUBCUTANEOUS
  Filled 2017-03-25: qty 0.4

## 2017-03-25 MED ORDER — METHYLPREDNISOLONE SODIUM SUCC 40 MG IJ SOLR
40.0000 mg | Freq: Four times a day (QID) | INTRAMUSCULAR | Status: DC
  Administered 2017-03-25 – 2017-03-27 (×8): 40 mg via INTRAVENOUS
  Filled 2017-03-25 (×8): qty 1

## 2017-03-25 MED ORDER — POTASSIUM CHLORIDE IN NACL 20-0.9 MEQ/L-% IV SOLN
INTRAVENOUS | Status: DC
  Administered 2017-03-26: 21:00:00 via INTRAVENOUS
  Filled 2017-03-25 (×3): qty 1000

## 2017-03-25 MED ORDER — IPRATROPIUM-ALBUTEROL 0.5-2.5 (3) MG/3ML IN SOLN
3.0000 mL | Freq: Four times a day (QID) | RESPIRATORY_TRACT | Status: DC
  Administered 2017-03-25: 3 mL via RESPIRATORY_TRACT
  Filled 2017-03-25: qty 3

## 2017-03-25 MED ORDER — OXYCODONE HCL 5 MG PO TABS: 5.0000 mg | ORAL_TABLET | ORAL | Status: DC | PRN

## 2017-03-25 MED ORDER — THIAMINE HCL 100 MG/ML IJ SOLN
Freq: Once | INTRAVENOUS | Status: AC
  Administered 2017-03-25: 18:00:00 via INTRAVENOUS
  Filled 2017-03-25: qty 1000

## 2017-03-25 MED ORDER — BENZONATATE 100 MG PO CAPS
200.0000 mg | ORAL_CAPSULE | Freq: Three times a day (TID) | ORAL | Status: DC
  Administered 2017-03-25 – 2017-03-27 (×6): 200 mg via ORAL
  Filled 2017-03-25 (×6): qty 2

## 2017-03-25 MED ORDER — IOPAMIDOL (ISOVUE-300) INJECTION 61%
INTRAVENOUS | Status: AC
  Administered 2017-03-25: 100 mL via INTRAVENOUS
  Filled 2017-03-25: qty 100

## 2017-03-25 MED ORDER — POLYETHYLENE GLYCOL 3350 17 G PO PACK: 17.0000 g | PACK | Freq: Every day | ORAL | Status: DC | PRN

## 2017-03-25 MED ORDER — BENAZEPRIL-HYDROCHLOROTHIAZIDE 20-12.5 MG PO TABS: 1.0000 | ORAL_TABLET | Freq: Every day | ORAL | Status: DC

## 2017-03-25 MED ORDER — SODIUM CHLORIDE 0.9 % IV SOLN: 250.0000 mL | INTRAVENOUS | Status: DC | PRN

## 2017-03-25 MED ORDER — PRAVASTATIN SODIUM 40 MG PO TABS
40.0000 mg | ORAL_TABLET | Freq: Every day | ORAL | Status: DC
  Administered 2017-03-26: 40 mg via ORAL
  Filled 2017-03-25: qty 1

## 2017-03-25 MED ORDER — VITAMIN B-12 100 MCG PO TABS
100.0000 ug | ORAL_TABLET | Freq: Every day | ORAL | Status: DC
  Administered 2017-03-25 – 2017-03-26 (×2): 100 ug via ORAL
  Filled 2017-03-25 (×2): qty 1

## 2017-03-25 MED ORDER — AMLODIPINE BESYLATE 10 MG PO TABS
10.0000 mg | ORAL_TABLET | Freq: Every day | ORAL | Status: DC
  Administered 2017-03-26 – 2017-03-27 (×2): 10 mg via ORAL
  Filled 2017-03-25 (×2): qty 1

## 2017-03-25 MED ORDER — GUAIFENESIN 100 MG/5ML PO SOLN
10.0000 mL | ORAL | Status: DC | PRN
  Administered 2017-03-25: 200 mg via ORAL
  Filled 2017-03-25: qty 5

## 2017-03-25 MED ORDER — METHYLPREDNISOLONE SODIUM SUCC 125 MG IJ SOLR
125.0000 mg | Freq: Once | INTRAMUSCULAR | Status: AC
  Administered 2017-03-25: 125 mg via INTRAVENOUS
  Filled 2017-03-25: qty 2

## 2017-03-25 MED ORDER — ACETAMINOPHEN 325 MG PO TABS: 650.0000 mg | ORAL_TABLET | Freq: Four times a day (QID) | ORAL | Status: DC | PRN

## 2017-03-25 MED ORDER — IPRATROPIUM-ALBUTEROL 0.5-2.5 (3) MG/3ML IN SOLN
3.0000 mL | Freq: Three times a day (TID) | RESPIRATORY_TRACT | Status: DC
  Administered 2017-03-26 – 2017-03-27 (×3): 3 mL via RESPIRATORY_TRACT
  Filled 2017-03-25 (×4): qty 3

## 2017-03-25 MED ORDER — ASPIRIN 325 MG PO TABS
325.0000 mg | ORAL_TABLET | Freq: Every day | ORAL | Status: DC
  Administered 2017-03-25 – 2017-03-26 (×2): 325 mg via ORAL
  Filled 2017-03-25 (×2): qty 1

## 2017-03-25 MED ORDER — LEVOTHYROXINE SODIUM 125 MCG PO TABS
125.0000 ug | ORAL_TABLET | Freq: Every day | ORAL | Status: DC
  Administered 2017-03-26 – 2017-03-27 (×2): 125 ug via ORAL
  Filled 2017-03-25 (×2): qty 1

## 2017-03-25 MED ORDER — ALBUTEROL (5 MG/ML) CONTINUOUS INHALATION SOLN
10.0000 mg/h | INHALATION_SOLUTION | RESPIRATORY_TRACT | Status: DC
  Administered 2017-03-25: 10 mg/h via RESPIRATORY_TRACT
  Filled 2017-03-25: qty 20

## 2017-03-25 MED ORDER — SODIUM CHLORIDE 0.9% FLUSH
3.0000 mL | Freq: Two times a day (BID) | INTRAVENOUS | Status: DC
  Administered 2017-03-25 – 2017-03-27 (×2): 3 mL via INTRAVENOUS

## 2017-03-25 MED ORDER — HYDROCHLOROTHIAZIDE 12.5 MG PO CAPS
12.5000 mg | ORAL_CAPSULE | Freq: Every day | ORAL | Status: DC
  Administered 2017-03-27: 12.5 mg via ORAL
  Filled 2017-03-25 (×2): qty 1

## 2017-03-25 MED ORDER — SODIUM CHLORIDE 0.9 % IV BOLUS (SEPSIS)
1000.0000 mL | Freq: Once | INTRAVENOUS | Status: AC
Start: 2017-03-25 — End: 2017-03-25
  Administered 2017-03-25: 1000 mL via INTRAVENOUS

## 2017-03-25 MED ORDER — ACETAMINOPHEN 650 MG RE SUPP: 650.0000 mg | Freq: Four times a day (QID) | RECTAL | Status: DC | PRN

## 2017-03-25 MED ORDER — BENAZEPRIL HCL 20 MG PO TABS
20.0000 mg | ORAL_TABLET | Freq: Every day | ORAL | Status: DC
  Administered 2017-03-26: 20 mg via ORAL
  Filled 2017-03-25 (×2): qty 1

## 2017-03-25 NOTE — ED Notes (Signed)
Pt has hx of COPD, myasthenia gravis, no home O2 at present-- has had hx of lung cancer-and bladder cancer.

## 2017-03-25 NOTE — Progress Notes (Signed)
ONCOLOGY COURTESY NOTE: I have alerted our thoracic oncology team re Mr [facility] admission and apparent metastatic spread of his previously diagnosed cancer. We will arrange for an outpatient biopsy and follow-up with Dr Julien Nordmann in the Thoracic Clinic at the [facility]. He may be discharged at your discretion.  Please let me know if I can be of further help.

## 2017-03-25 NOTE — Progress Notes (Signed)
BP is low MD made aware. Patient is asymptmatic and has IVF continuous.  BP med held and RN will continue to monitor at this time.

## 2017-03-25 NOTE — Progress Notes (Signed)
Patient alert and oriented to room and unit protocol. Patient has call bell within reach. He lives at home with wife and plan to return there.Patient has a watch  Left hearing aid on other valuables sent home with wife. Bed alarm set and patient placed on telemetry.

## 2017-03-25 NOTE — Consult Note (Signed)
Name: Chase Nguyen MRN: [id number] DOB: 10/30/1935    ADMISSION DATE:  03/25/2017 CONSULTATION DATE: 03/25/2017  REFERRING MD : Emergency department physician  CHIEF COMPLAINT: Failure to thrive  BRIEF PATIENT DESCRIPTION: Frail cachectic male  SIGNIFICANT EVENTS  03/25/2017 made DNR  STUDIES:  CT as noted   HISTORY OF PRESENT ILLNESS:   67  gentleman with extensive past medical history.  His past medical history is notable for having a left lower lobectomy in 1999 performed by Dr. Arlyce Dice.  He had chemotherapy per Dr. Beryle Beams.  He did stop smoking for a couple years.  He is also been diagnosed with myasthenia gravis and is followed by the neurology clinic.  He presents to the emergency room after being seen in the neurologist office as a noted 20 pound weight loss.  Increasing failure to thrive with inability to ambulate.  He has hoarse gurgling breath sounds.  He underwent a CT of the chest which showed extensive bilateral pulmonary spiculated nodules also showed a lumbar fracture.  After extensive conversation with him and his wife do not want diagnostic fiberoptic bronchoscopy or needle biopsies or chemoradiation therapy.  They have opted for palliative care with goal of care being comfort.  Very little for pulmonary critical care to offer at this time other than in the fact we made him a DNR.  Suggest hospitalist team to admit if he is admitted and palliative care to be involved in his care.  Pulmonary critical care will sign off at this time.  PAST MEDICAL HISTORY :   has a past medical history of Bilateral cataracts, Cancer of lower lobe of lung ([place]) (04/05/2011), COPD (chronic obstructive pulmonary disease) ([place]), Degenerative arthritis of spine, Emphysema of lung ([place]), Family hx of lung cancer, HTN (hypertension), bladder cancer, Hyperlipemia, Internal carotid artery occlusion, Left inguinal hernia, Obstructive airway disease ([place]), Skin cancer, Squamous cell carcinoma,  Stroke ([place]), and Tobacco abuse.  has a past surgical history that includes L VATS (11/ 30/ 1999); LEFT LOWER LOBECTOMY (05/09/1998); Tympanoplasty (07/11/1998); Flexible bronchoscopy w/ laser (05/22/1998,07/06/1999,09/21/1999,04/07/2000); Video bronchoscopy (11/02/1998); Myringotomy (07/06/1999); cystoscopy,transurethral resection of bladder (12/22/1999); fiberoptic bronch,bx of bronchial stem,bx of distal tracheal lesion,laser of distal tracheal lesion (06/02/1999); right canal wall down tympanoplasty/mastoidectomy,,myringotomy (08/23/2001); Cataract extraction; and Eye surgery. Prior to Admission medications   Medication Sig Start Date End Date Taking? Authorizing Provider  amLODipine (NORVASC) 10 MG tablet Take 10 mg by mouth daily.    Yes [provider]  aspirin 325 MG tablet Take 325 mg by mouth daily.   Yes [provider]  benazepril-hydrochlorthiazide (LOTENSIN HCT) 20-12.5 MG per tablet Take 1 tablet by mouth daily.   Yes [provider]  diclofenac (VOLTAREN) 75 MG EC tablet  11/26/15  Yes [provider]  gabapentin (NEURONTIN) 300 MG capsule  11/26/15  Yes [provider]  ipratropium-albuterol (DUONEB) 0.5-2.5 (3) MG/3ML SOLN  01/31/17  Yes [provider]  levothyroxine (SYNTHROID, LEVOTHROID) 125 MCG tablet  02/10/17  Yes [provider]  Multiple Vitamins-Minerals (PRESERVISION AREDS PO) Take 2 tablets by mouth daily.   Yes [provider]  pravastatin (PRAVACHOL) 40 MG tablet Take 40 mg by mouth daily.     Yes [provider]  vitamin B-12 (CYANOCOBALAMIN) 100 MCG tablet Take 100 mcg by mouth daily.   Yes [provider]  pyridostigmine (MESTINON) 60 MG tablet Take 1 tablet (60 mg total) by mouth 2 (two) times daily as needed (weakness, droopy eyelids, double vision). Patient not taking:  Reported on 03/25/2017 06/21/16   Alda Berthold, DO   No Known Allergies  FAMILY HISTORY:  family history  includes Cancer in his mother and sister; Diabetes in his brother; Heart attack in his father; Heart disease in his father; Hypertension in his father; Stroke in his sister; Varicose Veins in his father. SOCIAL HISTORY:  reports that he has been smoking cigarettes.  He has a 71.00 pack-year smoking history. He has quit using smokeless tobacco. He reports that he does not drink alcohol or use drugs.  REVIEW OF SYSTEMS:   10 point review of system taken, please see HPI for positives and negatives.  SUBJECTIVE:  82 year old male despite having lung cancer 20 years ago has continued to smoke 16-17 cigarettes a day.  He and his wife have opted for palliation rather than curative option.  VITAL SIGNS: Temp:  [98.2 F (36.8 C)-98.4 F (36.9 C)] 98.2 F (36.8 C) (02/22 0857) Pulse Rate:  [72-139] 72 (02/22 1421) Resp:  [18-32] 30 (02/22 1421) BP: (92-151)/(43-125) 106/49 (02/22 1330) SpO2:  [89 %-100 %] 89 % (02/22 1421) Weight:  [63.1 kg (139 lb 2 oz)] 63.1 kg (139 lb 2 oz) (02/22 0805)  PHYSICAL EXAMINATION: General: Frail wasted male who do although hard of hearing can carry on a adequate conversation. Neuro: Noted to have a lumbar fracture on CT scan able to wiggle toes follows commands.  He has a left hearing aid in place.    HEENT: No JVD or lymphadenopathy appreciated, he does have a hoarse voice and gurgling respirations that clear with appropriate jaw thrust.  Heart sounds are distant but regular Cardiovascular:  HSD Lungs: Decreased breath sounds in the bases gurgling respirations that clear most of this in his throat and he does have a history of tracheal cancer. Abdomen: Soft nontender Musculoskeletal: Lower extremities are intact, examined his lower spine did not see an obvious fracture. Skin: Multiple areas of subcu keratosis, warm and dry  Recent Labs  Lab 03/25/17 0908  NA 135  K 4.3  CL 102  CO2 22  BUN 33*  CREATININE 1.30*  GLUCOSE 109*   Recent Labs  Lab  03/25/17 0908  HGB 11.1*  HCT 34.3*  WBC 8.8  PLT 199   Dg Chest 2 View  Result Date: 03/25/2017 CLINICAL DATA:  Cough EXAM: CHEST  2 VIEW COMPARISON:  01/31/2017 FINDINGS: There is hyperinflation of the lungs compatible with COPD. Heart is mildly enlarged. Lingular scarring. Small left pleural effusion versus pleural thickening, stable since prior study. Postoperative changes on the left. Calcifications in the aorta. IMPRESSION: COPD. Postoperative and chronic changes on the left. Blunting of the left costophrenic angle felt to be related to pleural thickening, less likely chronic small effusion. Electronically Signed   By: Rolm Baptise M.D.   On: 03/25/2017 09:41   Ct Chest W Contrast  Result Date: 03/25/2017 CLINICAL DATA:  20 pound weight loss. Productive cough. Low back pain. History of lung cancer and myasthenia gravis. EXAM: CT CHEST, ABDOMEN, AND PELVIS WITH CONTRAST TECHNIQUE: Multidetector CT imaging of the chest, abdomen and pelvis was performed following the standard protocol during bolus administration of intravenous contrast. CONTRAST:  100 mL Isovue-300 COMPARISON:  Chest CT 03/16/2013. CT abdomen and pelvis 02/24/2006. Lumbar spine MRI 11/06/2015. FINDINGS: CT CHEST FINDINGS Cardiovascular: Extensive thoracic aortic atherosclerosis without aneurysm. Extensive coronary artery atherosclerosis. Normal heart size. Small pericardial effusion, decreased from 2015. Mediastinum/Nodes: New enlarged right hilar lymph node measures 17 mm in short axis. Scattered small  mediastinal lymph nodes have slightly increased in number from 2015 but are all 6 mm or less in short axis. There is a small sliding hiatal hernia. The thyroid is grossly unremarkable. Lungs/Pleura: No pleural effusion or pneumothorax. Prior left lower lobectomy. Moderate centrilobular emphysema. Mild bronchial wall thickening diffusely. Multiple new subcentimeter nodules in both lungs. New spiculated nodules measure 12 x 11 mm in the  medial right upper lobe (series 4, image 43), 7 x 7 mm in the medial left upper lobe (series 4, image 45), and 18 x 13 mm in the posterior left upper lobe (series 4, image 63). A new lobular enhancing nodule medially in the superior segment of the right lower lobe measures 18 x 13 mm (series 4, image 78). 17 x 12 mm subpleural ground-glass nodular opacity in the medial left upper lobe is unchanged (series 4, image 50). Musculoskeletal: Chronic T10 compression fracture is unchanged. No suspicious osseous lesion. CT ABDOMEN PELVIS FINDINGS Hepatobiliary: 7 mm low-density lesion in segment II is unchanged from the prior chest CT and too small to fully characterize. A calcification is again noted more inferiorly in the left hepatic lobe. There are small calcified stones in the gallbladder. No biliary dilatation is seen. Pancreas: Unremarkable. Spleen: Unremarkable. Adrenals/Urinary Tract: Unremarkable adrenal glands. Left lower pole renal scarring. Mild enlargement of left renal cysts which measure up to 3.1 cm in size. No hydronephrosis. Unremarkable bladder. Stomach/Bowel: No gross bowel wall thickening or obstruction, with motion artifact mildly limiting assessment. Vascular/Lymphatic: Extensive atherosclerotic calcification of the abdominal aorta and its major branch vessels. New infrarenal abdominal aortic aneurysm measuring 3.6 cm in maximal transverse diameter and with a configuration which may indicate an old short segment dissection. New 1.1 cm right internal iliac artery aneurysm. No enlarged lymph nodes. Reproductive: Mild prostatic enlargement. Other: No intraperitoneal free fluid. No abdominal wall hernia. Musculoskeletal: Chronic T12 and L4 compression fractures. Permeative appearance of mixed lucency and sclerosis in the right lateral aspect of the L1 vertebral body with new mild right-sided vertebral body height loss and heterogeneous right paravertebral soft tissue mass measuring approximately 5 cm.  IMPRESSION: 1. Evidence of thoracic metastatic disease with multiple new bilateral lung nodules and right hilar lymphadenopathy. Dominant nodules in both upper lobes and right upper lobe may reflect one or more primary lung cancers. 2. New L1 compression fracture with appearance concerning for a pathologic fracture from an underlying metastasis. 3. New 3.6 cm infrarenal abdominal aortic aneurysm. Recommend followup by ultrasound in 2 years. This recommendation follows ACR consensus guidelines: White Paper of the ACR Incidental Findings Committee II on Vascular Findings. J Am Coll Radiol 2013; 10:789-794. Aortic Atherosclerosis (ICD10-I70.0). Emphysema (ICD10-J43.9). Aortic aneurysm NOS (ICD10-I71.9). Electronically Signed   By: Logan Bores M.D.   On: 03/25/2017 13:47   Ct Abdomen Pelvis W Contrast  Result Date: 03/25/2017 CLINICAL DATA:  20 pound weight loss. Productive cough. Low back pain. History of lung cancer and myasthenia gravis. EXAM: CT CHEST, ABDOMEN, AND PELVIS WITH CONTRAST TECHNIQUE: Multidetector CT imaging of the chest, abdomen and pelvis was performed following the standard protocol during bolus administration of intravenous contrast. CONTRAST:  100 mL Isovue-300 COMPARISON:  Chest CT 03/16/2013. CT abdomen and pelvis 02/24/2006. Lumbar spine MRI 11/06/2015. FINDINGS: CT CHEST FINDINGS Cardiovascular: Extensive thoracic aortic atherosclerosis without aneurysm. Extensive coronary artery atherosclerosis. Normal heart size. Small pericardial effusion, decreased from 2015. Mediastinum/Nodes: New enlarged right hilar lymph node measures 17 mm in short axis. Scattered small mediastinal lymph nodes have slightly increased in  number from 2015 but are all 6 mm or less in short axis. There is a small sliding hiatal hernia. The thyroid is grossly unremarkable. Lungs/Pleura: No pleural effusion or pneumothorax. Prior left lower lobectomy. Moderate centrilobular emphysema. Mild bronchial wall thickening  diffusely. Multiple new subcentimeter nodules in both lungs. New spiculated nodules measure 12 x 11 mm in the medial right upper lobe (series 4, image 43), 7 x 7 mm in the medial left upper lobe (series 4, image 45), and 18 x 13 mm in the posterior left upper lobe (series 4, image 63). A new lobular enhancing nodule medially in the superior segment of the right lower lobe measures 18 x 13 mm (series 4, image 78). 17 x 12 mm subpleural ground-glass nodular opacity in the medial left upper lobe is unchanged (series 4, image 50). Musculoskeletal: Chronic T10 compression fracture is unchanged. No suspicious osseous lesion. CT ABDOMEN PELVIS FINDINGS Hepatobiliary: 7 mm low-density lesion in segment II is unchanged from the prior chest CT and too small to fully characterize. A calcification is again noted more inferiorly in the left hepatic lobe. There are small calcified stones in the gallbladder. No biliary dilatation is seen. Pancreas: Unremarkable. Spleen: Unremarkable. Adrenals/Urinary Tract: Unremarkable adrenal glands. Left lower pole renal scarring. Mild enlargement of left renal cysts which measure up to 3.1 cm in size. No hydronephrosis. Unremarkable bladder. Stomach/Bowel: No gross bowel wall thickening or obstruction, with motion artifact mildly limiting assessment. Vascular/Lymphatic: Extensive atherosclerotic calcification of the abdominal aorta and its major branch vessels. New infrarenal abdominal aortic aneurysm measuring 3.6 cm in maximal transverse diameter and with a configuration which may indicate an old short segment dissection. New 1.1 cm right internal iliac artery aneurysm. No enlarged lymph nodes. Reproductive: Mild prostatic enlargement. Other: No intraperitoneal free fluid. No abdominal wall hernia. Musculoskeletal: Chronic T12 and L4 compression fractures. Permeative appearance of mixed lucency and sclerosis in the right lateral aspect of the L1 vertebral body with new mild right-sided  vertebral body height loss and heterogeneous right paravertebral soft tissue mass measuring approximately 5 cm. IMPRESSION: 1. Evidence of thoracic metastatic disease with multiple new bilateral lung nodules and right hilar lymphadenopathy. Dominant nodules in both upper lobes and right upper lobe may reflect one or more primary lung cancers. 2. New L1 compression fracture with appearance concerning for a pathologic fracture from an underlying metastasis. 3. New 3.6 cm infrarenal abdominal aortic aneurysm. Recommend followup by ultrasound in 2 years. This recommendation follows ACR consensus guidelines: White Paper of the ACR Incidental Findings Committee II on Vascular Findings. J Am Coll Radiol 2013; 10:789-794. Aortic Atherosclerosis (ICD10-I70.0). Emphysema (ICD10-J43.9). Aortic aneurysm NOS (ICD10-I71.9). Electronically Signed   By: Logan Bores M.D.   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   Cancer of lower lobe of lung ([facility])   Tracheal cancer (HCC)   Myasthenia gravis, AChR antibody positive (HCC)    (HCC)bilateral lung nodules   FTT (failure to thrive) in adult, decrease over 3 months     Discussion 67  gentleman with extensive past medical history.  His past medical history is notable for having a left lower lobectomy in 1999 performed by  Dr. Arlyce Dice.  He had chemotherapy per Dr. Beryle Beams.  He did stop smoking for a couple years.  He is also been diagnosed with myasthenia gravis and is followed by the neurology clinic.  He presents to the emergency room after being seen in the neurologist office as a noted 20 pound weight loss.  Increasing failure to thrive with inability to ambulate.  He has hoarse gurgling breath sounds.   He underwent a CT of the chest which showed extensive bilateral pulmonary spiculated nodules also showed a lumbar fracture.   Plan:  After extensive conversation with him and his wife do not want diagnostic fiberoptic bronchoscopy or needle biopsies or chemoradiation therapy.  They have opted for palliative care with goal of care being comfort.  Very little for pulmonary critical care to offer at this time other than in the fact we made him a DNR.  Suggest hospitalist team to admit if he is admitted and palliative care to be involved in his care.  Pulmonary critical care will sign off at this time.   Richardson Landry Micheale Schlack ACNP [facility] PCCM Pager [phone] till 1 pm If no answer page [phone] 03/25/2017, 3:47 PM      PLAN:

## 2017-03-25 NOTE — Patient Instructions (Signed)
Return to clinic in 6 months.

## 2017-03-25 NOTE — ED Triage Notes (Signed)
Pt in c/o weight loss- approx 20 lbs in the last month, decreased PO intake, reports feeling cold all of the time. Went to their PCP and were told that there was nothing they could do so they came in for evaluation, pt denies complaints or pain but states something is not right

## 2017-03-25 NOTE — Progress Notes (Signed)
Palliative Medicine RN Note: Consult order noted. I saw the patient and his son in the ED. Plan for meeting tomorrow am with patient, his wife, and his son.  Marjie Skiff Runette Scifres, RN, BSN, [facility] Palliative Medicine Team 03/25/2017 6:20 PM Office [phone]

## 2017-03-25 NOTE — Progress Notes (Signed)
Follow-up Visit   Date: 03/25/17    Chase Nguyen MRN: [id number] DOB: [birth year]   Interim History: [name] is a 82 y.o. 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 About two weeks later, he developed droopiness of his eyes with diurnal variation. He denies double vision, problems with talking, or shortness of breath. His wife says that he has always tended to keep his neck flexed for several years, but it has worsened over the past several weeks. He has occasional problems with swallowing.   He saw Dr. Celesta Aver, opthalmology and recommended lab testing for myasthenia. His acetylcholine receptor binding antibody return positive (1.77, normal <0.24).  He was initially seen in the clinic on 03/13/2013 at which time mestinon 58m TID and prednisone 167mwas started.  He had significant improvement with ptosis and neck strength after taking mestinon.   In June 2015, he was found to have vertebral T12 compression fracture and severe osteopenia.    He was on a slow prednisone taper (78m34month) and successfully stopped corticosteorids in December 2016.  He was doing well on mestinon 19m70mD and was able to taper this to only as needed.  He did not have any breakthrough MG-symptoms.  UPDATE 12/22/2015:   He is here for 6 month follow-up appointment.  He remains asymptomatic with respect to his myasthenia gravis.  He self-tapered his mestinon 19mg104m1 tablet daily and remains off prednisone.  He is seeing Dr. DittyCyndy Freezehis  low back pain and underwent ESI last week, without marked improvement.    UPDATE 06/21/2016:  He is here for 6-mon69-monthw-up visit.  He is doing well and has no new complaints.  He has self-discontinued his mestinon and denies any double vision, generalized weakness, of facial weakness. He suffered one fall while turning too quickly and did not sustain any injuries.  He continues to was unassisted, despite my recommendation to use a cane/walker.    UPDATE 03/25/2017:  He is here for 9 month follow-up visit. He arrives in a wheelchair today looking very pale and ill.   His myasthenia is doing well and he has no new symptoms of double vision, difficulty swallowing/talking, but does complain of generalized weakness.   Since December 2018, he has been having weight loss, low back pain, malaise, and reduce PO intake.  He was evaluated by his PCP in December and treated for COPD exacerbation, but did not feel any better.  He continues to have ongoing weight loss, chills, lack of appetite and over the past week, developed a new cough.  Wife states that he stays cold constantly and is worried about him because he is sleeping more.  He did not get out of bed until 1pm yesterday and only stood up twice to smoke, whereas normally he smokes 10-12 cigarettes daily.  He also has lightheadedness, especially when standing up or walking.   Medications:  Current Outpatient Medications on File Prior to Visit  Medication Sig Dispense Refill  . amLODipine (NORVASC) 10 MG tablet Take 10 mg  by mouth daily.     Marland Kitchen aspirin 325 MG tablet Take 325 mg by mouth daily.    . benazepril-hydrochlorthiazide (LOTENSIN HCT) 20-12.5 MG per tablet Take 1 tablet by mouth daily.    . Cholecalciferol (VITAMIN D-3 PO) Take by mouth.    . diclofenac (VOLTAREN) 75 MG EC tablet     . gabapentin (NEURONTIN) 300 MG capsule     . ipratropium-albuterol (DUONEB) 0.5-2.5 (3) MG/3ML SOLN     . levothyroxine (SYNTHROID, LEVOTHROID) 125 MCG tablet     .  Multiple Vitamins-Minerals (PRESERVISION AREDS PO) Take 2 tablets by mouth daily.    . pravastatin (PRAVACHOL) 40 MG tablet Take 40 mg by mouth daily.      Marland Kitchen pyridostigmine (MESTINON) 60 MG tablet Take 1 tablet (60 mg total) by mouth 2 (two) times daily as needed (weakness, droopy eyelids, double vision). 60 tablet 5  . vitamin B-12 (CYANOCOBALAMIN) 100 MCG tablet Take 100 mcg by mouth daily.     No current facility-administered medications on file prior to visit.     Allergies: None  Review of Systems:  CONSTITUTIONAL: No fevers, chills, night sweats.  EYES: +visual changes or eye pain  ENT: +hearing changes. No history of nose bleeds.  RESPIRATORY: No cough, wheezing, shortness of breath.  CARDIOVASCULAR: Negative for chest pain, and palpitations.  GI: Negative for abdominal discomfort, blood in stools or black stools. No recent change in bowel habits.  GU: No history of incontinence.  MUSCLOSKELETAL: + history of joint pain or swelling. No myalgias.  SKIN: Negative for lesions, rash, and itching.  HEMATOLOGY/ONCOLOGY: Negative for prolonged bleeding, bruising easily, and swollen nodes. + history of cancer.  ENDOCRINE: No cold or heat intolerance, polydipsia or goiter.  PSYCH: No depression or anxiety symptoms.  NEURO: As Above.   Vital Signs:  BP (!) 100/48   Pulse 88   Temp 98.4 F (36.9 C)   Ht 5' 8"  (1.727 m)   Wt 139 lb 2 oz (63.1 kg)   SpO2 98%   BMI 21.15 kg/m   General:  He is sitting a wheelchair today covered in blanket, ill-appearing, sunken temples   CV:  Rate is tachycardic, regular rhythm, no murmur Pulm:  Bilateral rhonchi, reduced breath sounds Ext:  Right hemiparesis from old stroke  Neurological Exam: MENTAL STATUS including orientation to time, place, person is normal.  Speech is mildly dysarthric (old).  CRANIAL NERVES:  Pupils are small, equal round and reactive to light.  He is blind in the left eye (AION)  Normal conjugate, extra-ocular eye  movements in all directions of gaze.  No ptosis with sustained upward gaze.   Face is symmetric.  Facial muscles are 5/5.    Tongue is midline.  MOTOR:  Motor strength of upper extremities is 5/5 bilaterally, except 4+/5 with shoulder abduction in the right, biceps and triceps is 5/5.  Tone remains mildly increased in the RUE.  He has residual weakness of the right lower extremity from old stroke, RLE is 2+/5 (worse).  LLE is 4/5 (worse)  COORDINATION/GAIT: Gait is not tested today as patient is in wheelchair and too unsteady to walk  Data: Labs 03/05/2013: AChR binding 1.77*, CRP 3.6, ESR 6  MRI lumbar spine wo contrast 07/17/2013: 1. Acute to subacute T12 compression fracture with mild loss of height. No retropulsion of bone. A benign osteoporotic fracture is  favored over pathologic fracture (e.g. due to T12 metastasis in this setting).  2. Meet marrow edema in the lower  lumbar spine including at L5-S1 is favored to be degenerative in nature. No definite lumbar metastatic disease.  3. Chronically advanced lumbar spine degeneration, stable since 2011 except at L5-S1 where bilateral lateral recess stenosis appears progressed.   IMPRESSION/PLAN: 1.  Acute respiratory illness, low blood pressure - new.  Ddx: influenza, community acquired pneumonia, COPD exacerbation, malignancy. I am concerned that he maybe developing more of an infectious process. With his constitutional symptoms of 20+lb weight loss over the last 2 months and chills, and along with low blood pressure due to dehydration, I recommend that he go to the emergency department for evaluation and management. With his strong history of smoking, malignancy is also considered.  I have discussed his care with Dr. Laurann Montana, his PCP, who agrees with plan.  2.  Generalized myasthenia gravis, AChR binding positive, thymoma negative (dx 03/2013).  He is asymptomatic with respect to myasthenia gravis which is well controlled off medications (prednisone  discontinued in December 2016, off mestinon since early 2018).  Continue mestinon 38m 1-2 tablets as needed.  3.  Spinal stenosis with neurogenic claudication, chronic low back pain due to advanced degenerative changes of the lumbar spine and foraminal stenosis.  History of T12 compression fracture, severe osteopenia.  Followed by Dr. DCyndy Freeze He is not a candidate for surgery.   4. History of stroke with residual right hemisensory loss and right leg weakness, carotid stenosis s/p L CEA (2008).  With his acute illness, there is some degree of recrudescence of his hold stroke symptoms with greater right sided weakness.   Continue medical management with aspirin and statin therapy.    5. History of left AION with residual visual field deficits  Return to clinic 6 months   Thank you for allowing me to participate in patient's care.  If I can answer any additional questions, I would be pleased to do so.    Sincerely,    Zarah Carbon K. PPosey Pronto DO

## 2017-03-25 NOTE — ED Provider Notes (Signed)
[facility] EMERGENCY DEPARTMENT Provider Note   CSN: [id number] Arrival date & time: 03/25/17  0867     History   Chief Complaint Chief Complaint  Patient presents with  . Weight Loss  . Shortness of Breath    HPI Chase Nguyen is a 82 y.o. male.  Pt presents to the ED today with a 20 pound weight loss.  He has not been eating or drinking well.  He feels cold all the time.  The pt was treated for a COPD exacerbation in December by his pcp, but uri sx did not improve.  The pt has a hx of myasthenia gravis and saw his neurologist today who sent him here.  No evidence of MG on neurologist exam.  Pt's wife said he's been so sob the last few days, that he's been unable to smoke.      Past Medical History:  Diagnosis Date  . Bilateral cataracts   . Cancer of lower lobe of lung ([place]) 04/05/2011  . COPD (chronic obstructive pulmonary disease) ([place])   . Degenerative arthritis of spine    AND HIPS  . Emphysema of lung ([place])   . Family hx of lung cancer   . HTN (hypertension)   . Hx of bladder cancer   . Hyperlipemia   . Internal carotid artery occlusion   . Left inguinal hernia   . Obstructive airway disease ([place])   . Skin cancer    RIGHT EAR AND BACK  . 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  . Stroke ([place])   . Tobacco abuse     Patient Active Problem List   Diagnosis Date Noted  . Lumbago 07/17/2014  . Myasthenia gravis, AChR antibody positive ([place]) 03/13/2013  . Occlusion and stenosis of carotid artery without mention of cerebral infarction 06/29/2011  . Cancer of lower lobe of lung ([place]) 04/05/2011  . Tracheal cancer ([place]) 04/05/2011  . 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   . Left inguinal hernia   . Hyperlipemia   . HTN (hypertension)   . Bilateral cataracts   . Skin cancer   . Family hx of lung cancer   . Obstructive  airway disease ([place])   . Degenerative arthritis of spine     Past Surgical History:  Procedure Laterality Date  . CATARACT EXTRACTION    . cystoscopy,transurethral resection of bladder  12/22/1999  . EYE SURGERY    . fiberoptic bronch,bx of bronchial stem,bx of distal tracheal lesion,laser of distal tracheal lesion  06/02/1999  . FLEXIBLE BRONCHOSCOPY W/ LASER  05/22/1998,07/06/1999,09/21/1999,04/07/2000  . L VATS  11/ 30/ 1999  . LEFT LOWER LOBECTOMY  05/09/1998  . MYRINGOTOMY  07/06/1999   with left tube placement  . right canal wall down tympanoplasty/mastoidectomy,,myringotomy  08/23/2001  . TYMPANOPLASTY  07/11/1998   WITH OSSICULOPLASTY/LEFT  . VIDEO BRONCHOSCOPY  11/02/1998       Home Medications    Prior to Admission medications   Medication Sig Start Date End Date Taking? Authorizing Provider  amLODipine (NORVASC) 10 MG tablet Take 10 mg by mouth daily.    Yes [provider]  aspirin 325 MG tablet Take 325 mg by mouth daily.   Yes [provider]  benazepril-hydrochlorthiazide (LOTENSIN HCT) 20-12.5 MG per tablet Take 1 tablet by mouth daily.   Yes [provider]  diclofenac (VOLTAREN) 75 MG EC tablet  11/26/15  Yes [provider]  gabapentin (NEURONTIN) 300 MG capsule  11/26/15  Yes [provider]  ipratropium-albuterol (DUONEB) 0.5-2.5 (3) MG/3ML SOLN  01/31/17  Yes [provider]  levothyroxine (SYNTHROID, LEVOTHROID) 125 MCG tablet  02/10/17  Yes [provider]  Multiple Vitamins-Minerals (PRESERVISION AREDS PO) Take 2 tablets by mouth daily.   Yes [provider]  pravastatin (PRAVACHOL) 40 MG tablet Take 40 mg by mouth daily.     Yes [provider]  vitamin B-12 (CYANOCOBALAMIN) 100 MCG tablet Take 100 mcg by mouth daily.   Yes [provider]  pyridostigmine (MESTINON) 60 MG tablet Take 1 tablet (60 mg total) by mouth 2 (two) times daily as needed (weakness, droopy  eyelids, double vision). Patient not taking: Reported on 03/25/2017 06/21/16   Alda Berthold, DO    Family History Family History  Problem Relation Age of Onset  . Heart attack Father   . Hypertension Father   . Heart disease Father   . Varicose Veins Father   . Cancer Mother   . Diabetes Brother   . Cancer Sister   . Stroke Sister     Social History Social History   Tobacco Use  . Smoking status: Current Every Day Smoker    Packs/day: 1.00    Years: 71.00    Pack years: 71.00    Types: Cigarettes  . Smokeless tobacco: Former Network engineer Use Topics  . Alcohol use: No    Alcohol/week: 0.0 oz  . Drug use: No     Allergies   Patient has no known allergies.   Review of Systems Review of Systems  Constitutional: Positive for appetite change and fatigue.  Neurological: Positive for weakness.  All other systems reviewed and are negative.    Physical Exam Updated Vital Signs BP (!) 106/49   Pulse 72   Temp 98.2 F (36.8 C) (Oral)   Resp 16   SpO2 (S) (!) 89% Comment: Pt taken off [place] and desated down to 89%.  Pt placed back on 2L   Physical Exam  Constitutional: He is oriented to person, place, and time. He appears well-developed and well-nourished.  HENT:  Head: Normocephalic and atraumatic.  Right Ear: External ear normal.  Left Ear: External ear normal.  Nose: Nose normal.  Mouth/Throat: Oropharynx is clear and moist.  Eyes: Conjunctivae and EOM are normal. Pupils are equal, round, and reactive to light.  Neck: Normal range of motion. Neck supple.  Cardiovascular: Normal rate, regular rhythm, normal heart sounds and intact distal pulses.  Pulmonary/Chest: Effort normal. He has wheezes. He has rhonchi.  Abdominal: Soft. Bowel sounds are normal.  Musculoskeletal: Normal range of motion.  Neurological: He is alert and oriented to person, place, and time.  Generalized weakness  Skin: Skin is warm and dry. Capillary refill takes less than 2 seconds.    Psychiatric: He has a normal mood and affect.  Nursing note and vitals reviewed.    ED Treatments / Results  Labs (all labs ordered are listed, but only abnormal results are displayed) Labs Reviewed  BASIC METABOLIC PANEL - Abnormal; Notable for the following components:      Result Value   Glucose, Bld 109 (*)    BUN 33 (*)    Creatinine, Ser 1.30 (*)    Calcium 8.7 (*)    GFR calc non Af Amer 51 (*)    GFR calc Af Amer 59 (*)    All other components within normal limits  CBC - Abnormal; Notable for the following components:   RBC 3.62 (*)    Hemoglobin 11.1 (*)    HCT 34.3 (*)    All other components within normal limits  URINALYSIS, ROUTINE W REFLEX MICROSCOPIC    EKG  EKG Interpretation None       Radiology Dg Chest 2 View  Result Date: 03/25/2017 CLINICAL DATA:  Cough EXAM: CHEST  2 VIEW COMPARISON:  01/31/2017 FINDINGS: There is hyperinflation of the lungs compatible with COPD. Heart is mildly enlarged. Lingular scarring. Small left pleural effusion versus pleural thickening, stable since prior study. Postoperative changes on the left. Calcifications in the aorta. IMPRESSION: COPD. Postoperative and chronic changes on the left. Blunting of the left costophrenic angle felt to be related to pleural thickening, less likely chronic small effusion. Electronically Signed   By: Rolm Baptise M.D.   On: 03/25/2017 09:41   Ct Chest W Contrast  Result Date: 03/25/2017 CLINICAL DATA:  20 pound weight loss. Productive cough. Low back pain. History of lung cancer and myasthenia gravis. EXAM: CT CHEST, ABDOMEN, AND PELVIS WITH CONTRAST TECHNIQUE: Multidetector CT imaging of the chest, abdomen and pelvis was performed following the standard protocol during bolus administration of intravenous contrast. CONTRAST:  100 mL Isovue-300 COMPARISON:  Chest CT 03/16/2013. CT abdomen and pelvis 02/24/2006. Lumbar spine MRI 11/06/2015. FINDINGS: CT CHEST FINDINGS Cardiovascular: Extensive  thoracic aortic atherosclerosis without aneurysm. Extensive coronary artery atherosclerosis. Normal heart size. Small pericardial effusion, decreased from 2015. Mediastinum/Nodes: New enlarged right hilar lymph node measures 17 mm in short axis. Scattered small mediastinal lymph nodes have slightly increased in number from 2015 but are all 6 mm or less in short axis. There is a small sliding hiatal hernia. The thyroid is grossly unremarkable. Lungs/Pleura: No pleural effusion or pneumothorax. Prior left lower lobectomy. Moderate centrilobular emphysema. Mild bronchial wall thickening diffusely. Multiple new subcentimeter nodules in both lungs. New spiculated nodules measure 12 x 11 mm in the medial right upper lobe (series 4, image 43), 7 x 7 mm in the medial left upper lobe (series 4, image 45), and 18 x 13 mm in the posterior left upper lobe (series 4, image 63). A new lobular enhancing nodule medially in the superior segment of the right lower lobe measures 18 x 13 mm (series 4, image 78). 17 x 12 mm subpleural ground-glass nodular opacity in the medial left upper lobe is unchanged (series 4, image 50). Musculoskeletal: Chronic T10 compression fracture is unchanged. No suspicious osseous lesion. CT ABDOMEN PELVIS FINDINGS Hepatobiliary: 7 mm low-density lesion in segment II is unchanged from the prior chest CT and too small to fully characterize. A calcification is again noted more inferiorly in the left hepatic lobe. There are small calcified stones in the gallbladder. No biliary dilatation is seen. Pancreas: Unremarkable. Spleen: Unremarkable. Adrenals/Urinary Tract: Unremarkable adrenal glands. Left lower pole renal scarring. Mild enlargement of left renal cysts which measure up to 3.1 cm in size. No hydronephrosis. Unremarkable bladder. Stomach/Bowel: No gross bowel wall thickening or obstruction, with motion artifact mildly limiting assessment. Vascular/Lymphatic: Extensive atherosclerotic calcification of  the abdominal aorta and its major branch vessels. New infrarenal abdominal aortic aneurysm measuring 3.6 cm in maximal transverse diameter and with a configuration which may indicate an old short segment dissection. New 1.1 cm right internal iliac artery aneurysm. No enlarged lymph nodes. Reproductive: Mild prostatic enlargement. Other: No intraperitoneal free fluid. No abdominal wall hernia. Musculoskeletal: Chronic T12 and L4 compression fractures. Permeative appearance of mixed lucency  and sclerosis in the right lateral aspect of the L1 vertebral body with new mild right-sided vertebral body height loss and heterogeneous right paravertebral soft tissue mass measuring approximately 5 cm. IMPRESSION: 1. Evidence of thoracic metastatic disease with multiple new bilateral lung nodules and right hilar lymphadenopathy. Dominant nodules in both upper lobes and right upper lobe may reflect one or more primary lung cancers. 2. New L1 compression fracture with appearance concerning for a pathologic fracture from an underlying metastasis. 3. New 3.6 cm infrarenal abdominal aortic aneurysm. Recommend followup by ultrasound in 2 years. This recommendation follows ACR consensus guidelines: White Paper of the ACR Incidental Findings Committee II on Vascular Findings. J Am Coll Radiol 2013; 10:789-794. Aortic Atherosclerosis (ICD10-I70.0). Emphysema (ICD10-J43.9). Aortic aneurysm NOS (ICD10-I71.9). Electronically Signed   By: Logan Bores M.D.   On: 03/25/2017 13:47   Ct Abdomen Pelvis W Contrast  Result Date: 03/25/2017 CLINICAL DATA:  20 pound weight loss. Productive cough. Low back pain. History of lung cancer and myasthenia gravis. EXAM: CT CHEST, ABDOMEN, AND PELVIS WITH CONTRAST TECHNIQUE: Multidetector CT imaging of the chest, abdomen and pelvis was performed following the standard protocol during bolus administration of intravenous contrast. CONTRAST:  100 mL Isovue-300 COMPARISON:  Chest CT 03/16/2013. CT abdomen  and pelvis 02/24/2006. Lumbar spine MRI 11/06/2015. FINDINGS: CT CHEST FINDINGS Cardiovascular: Extensive thoracic aortic atherosclerosis without aneurysm. Extensive coronary artery atherosclerosis. Normal heart size. Small pericardial effusion, decreased from 2015. Mediastinum/Nodes: New enlarged right hilar lymph node measures 17 mm in short axis. Scattered small mediastinal lymph nodes have slightly increased in number from 2015 but are all 6 mm or less in short axis. There is a small sliding hiatal hernia. The thyroid is grossly unremarkable. Lungs/Pleura: No pleural effusion or pneumothorax. Prior left lower lobectomy. Moderate centrilobular emphysema. Mild bronchial wall thickening diffusely. Multiple new subcentimeter nodules in both lungs. New spiculated nodules measure 12 x 11 mm in the medial right upper lobe (series 4, image 43), 7 x 7 mm in the medial left upper lobe (series 4, image 45), and 18 x 13 mm in the posterior left upper lobe (series 4, image 63). A new lobular enhancing nodule medially in the superior segment of the right lower lobe measures 18 x 13 mm (series 4, image 78). 17 x 12 mm subpleural ground-glass nodular opacity in the medial left upper lobe is unchanged (series 4, image 50). Musculoskeletal: Chronic T10 compression fracture is unchanged. No suspicious osseous lesion. CT ABDOMEN PELVIS FINDINGS Hepatobiliary: 7 mm low-density lesion in segment II is unchanged from the prior chest CT and too small to fully characterize. A calcification is again noted more inferiorly in the left hepatic lobe. There are small calcified stones in the gallbladder. No biliary dilatation is seen. Pancreas: Unremarkable. Spleen: Unremarkable. Adrenals/Urinary Tract: Unremarkable adrenal glands. Left lower pole renal scarring. Mild enlargement of left renal cysts which measure up to 3.1 cm in size. No hydronephrosis. Unremarkable bladder. Stomach/Bowel: No gross bowel wall thickening or obstruction, with  motion artifact mildly limiting assessment. Vascular/Lymphatic: Extensive atherosclerotic calcification of the abdominal aorta and its major branch vessels. New infrarenal abdominal aortic aneurysm measuring 3.6 cm in maximal transverse diameter and with a configuration which may indicate an old short segment dissection. New 1.1 cm right internal iliac artery aneurysm. No enlarged lymph nodes. Reproductive: Mild prostatic enlargement. Other: No intraperitoneal free fluid. No abdominal wall hernia. Musculoskeletal: Chronic T12 and L4 compression fractures. Permeative appearance of mixed lucency and sclerosis in the right lateral aspect  of the L1 vertebral body with new mild right-sided vertebral body height loss and heterogeneous right paravertebral soft tissue mass measuring approximately 5 cm. IMPRESSION: 1. Evidence of thoracic metastatic disease with multiple new bilateral lung nodules and right hilar lymphadenopathy. Dominant nodules in both upper lobes and right upper lobe may reflect one or more primary lung cancers. 2. New L1 compression fracture with appearance concerning for a pathologic fracture from an underlying metastasis. 3. New 3.6 cm infrarenal abdominal aortic aneurysm. Recommend followup by ultrasound in 2 years. This recommendation follows ACR consensus guidelines: White Paper of the ACR Incidental Findings Committee II on Vascular Findings. J Am Coll Radiol 2013; 10:789-794. Aortic Atherosclerosis (ICD10-I70.0). Emphysema (ICD10-J43.9). Aortic aneurysm NOS (ICD10-I71.9). Electronically Signed   By: Logan Bores M.D.   On: 03/25/2017 13:47    Procedures Procedures (including critical care time)  Medications Ordered in ED Medications  albuterol (PROVENTIL,VENTOLIN) solution continuous neb (0 mg/hr Nebulization Stopped 03/25/17 1119)  sodium chloride 0.9 % bolus 1,000 mL (0 mLs Intravenous Stopped 03/25/17 1214)  methylPREDNISolone sodium succinate (SOLU-MEDROL) 125 mg/2 mL injection 125 mg  (125 mg Intravenous Given 03/25/17 1012)  iopamidol (ISOVUE-300) 61 % injection (100 mLs Intravenous Contrast Given 03/25/17 1309)     Initial Impression / Assessment and Plan / ED Course  I have reviewed the triage vital signs and the nursing notes.  Pertinent labs & imaging results that were available during my care of the patient were reviewed by me and considered in my medical decision making (see chart for details).  CRITICAL CARE Performed by: Isla Pence   Total critical care time: 30  minutes  Critical care time was exclusive of separately billable procedures and treating other patients.  Critical care was necessary to treat or prevent imminent or life-threatening deterioration.  Critical care was time spent personally by me on the following activities: development of treatment plan with patient and/or surrogate as well as nursing, discussions with consultants, evaluation of patient's response to treatment, examination of patient, obtaining history from patient or surrogate, ordering and performing treatments and interventions, ordering and review of laboratory studies, ordering and review of radiographic studies, pulse oximetry and re-evaluation of patient's condition.   Pt's breathing has improved, but it is still labored.  He gets very sob with moving in bed.  O2 sats drop to 89% on RA.   New mets also need attention.  Pt d/w Dr. Evangeline Gula (triad) who will admit.  Final Clinical Impressions(s) / ED Diagnoses   Final diagnoses:  COPD exacerbation ([place])  Pathological fracture of lumbar vertebra, initial encounter  Malignant neoplasm metastatic to lung, unspecified laterality [facility])  Tobacco abuse    ED Discharge Orders    None       Isla Pence, MD 03/25/17 1426

## 2017-03-25 NOTE — H&P (Addendum)
History and Physical    Chase Nguyen HKV:[id number] DOB: [birth year] DOA: 03/25/2017  PCP: Lavone Orn, MD  Patient coming from: Neurologist office  I have personally briefly reviewed patient's old medical records in [place]  Chief Complaint: 20 pound weight loss and worsening shortness of breath since beginning of January  HPI: Chase Nguyen is a 82 y.o. male with medical history significant stroke, tobacco abuse, remote lung cancer originally diagnosed in 1999, COPD, myasthenia gravis and hypertension who presents to the emergency department at the request of his neurologist.  Patient states that he has had a 20 pound weight loss since beginning of January.  He has developed some wheezing since then as well.  He saw Dr. from his primary care physician who treated him for an exacerbation of COPD except he has not improved.  Given his persistent weight loss he went to see his neurologist today for evaluation of his myasthenia gravis.  She determined that his myasthenia gravis is not causing his weight loss and recommended he come to the emergency department because she noted he was hypoxemic.  He was noted to be approximately 80% in her office but then was found to be 89% in our emergency department.  He complains of significant wheezing and fatigue is very concerned about his weight loss.  Denies any hemoptysis or hematemesis no hematochezia.  He has never had a colonoscopy.  When his significant symptoms Dr. Gilford Raid in the emergency department ordered a CT scan which showed multiple spiculated lung metastases and a pathologic fracture of L1.  Patient was then referred to me for further evaluation and management.  Review of Systems: As per HPI otherwise complete review of systems negative.    Past Medical History:  Diagnosis Date  . Bilateral cataracts   . Cancer of lower lobe of lung ([place]) 04/05/2011  . Cancer of lower lobe of lung ([place]) 04/05/2011   Left lower lobectomy  T2N0  IB  squamous cell ca   . COPD (chronic obstructive pulmonary disease) ([place])   . Degenerative arthritis of spine    AND HIPS  . Emphysema of lung ([place])   . Family hx of lung cancer   . HTN (hypertension)   . Hx of bladder cancer   . Hyperlipemia   . Internal carotid artery occlusion   . Left inguinal hernia   . Obstructive airway disease ([place])   . Skin cancer    RIGHT EAR AND BACK  . 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    S/P Resecetion of Left Lower Lobe 1999     . Stroke ([place])   . Tobacco abuse     Past Surgical History:  Procedure Laterality Date  . CATARACT EXTRACTION    . cystoscopy,transurethral resection of bladder  12/22/1999  . EYE SURGERY    . fiberoptic bronch,bx of bronchial stem,bx of distal tracheal lesion,laser of distal tracheal lesion  06/02/1999  . FLEXIBLE BRONCHOSCOPY W/ LASER  05/22/1998,07/06/1999,09/21/1999,04/07/2000  . L VATS  11/ 30/ 1999  . LEFT LOWER LOBECTOMY  05/09/1998  . MYRINGOTOMY  07/06/1999   with left tube placement  . right canal wall down tympanoplasty/mastoidectomy,,myringotomy  08/23/2001  . TYMPANOPLASTY  07/11/1998   WITH OSSICULOPLASTY/LEFT  . VIDEO BRONCHOSCOPY  11/02/1998     reports that he has been smoking cigarettes.  He has  a 71.00 pack-year smoking history. He has quit using smokeless tobacco. He reports that he does not drink alcohol or use drugs.  No Known Allergies  Family History  Problem Relation Age of Onset  . Heart attack Father   . Hypertension Father   . Heart disease Father   . Varicose Veins Father   . Cancer Mother   . Diabetes Brother   . Cancer Sister   . Stroke Sister     Prior to Admission medications   Medication Sig Start Date End Date Taking? Authorizing Provider  amLODipine (NORVASC) 10 MG tablet Take 10 mg by mouth daily.    Yes  [provider]  aspirin 325 MG tablet Take 325 mg by mouth daily.   Yes [provider]  benazepril-hydrochlorthiazide (LOTENSIN HCT) 20-12.5 MG per tablet Take 1 tablet by mouth daily.   Yes [provider]  diclofenac (VOLTAREN) 75 MG EC tablet  11/26/15  Yes [provider]  gabapentin (NEURONTIN) 300 MG capsule  11/26/15  Yes [provider]  ipratropium-albuterol (DUONEB) 0.5-2.5 (3) MG/3ML SOLN  01/31/17  Yes [provider]  levothyroxine (SYNTHROID, LEVOTHROID) 125 MCG tablet  02/10/17  Yes [provider]  Multiple Vitamins-Minerals (PRESERVISION AREDS PO) Take 2 tablets by mouth daily.   Yes [provider]  pravastatin (PRAVACHOL) 40 MG tablet Take 40 mg by mouth daily.     Yes [provider]  vitamin B-12 (CYANOCOBALAMIN) 100 MCG tablet Take 100 mcg by mouth daily.   Yes [provider]  pyridostigmine (MESTINON) 60 MG tablet Take 1 tablet (60 mg total) by mouth 2 (two) times daily as needed (weakness, droopy eyelids, double vision). Patient not taking: Reported on 03/25/2017 06/21/16   Alda Berthold, DO    Physical Exam: Vitals:   03/25/17 1330 03/25/17 1415 03/25/17 1421 03/25/17 1530  BP: (!) 106/49   (!) 97/58  Pulse: 74 (!) 139 72 (!) 113  Resp: 20 (!) 32 (!) 30 19  Temp:      TempSrc:      SpO2: 97% 98% (S) (!) 89% 97%   .TCS Constitutional: NAD, calm, comfortable, frail thin evidence of recent weight loss with temporal wasting Vitals:   03/25/17 1330 03/25/17 1415 03/25/17 1421 03/25/17 1530  BP: (!) 106/49   (!) 97/58  Pulse: 74 (!) 139 72 (!) 113  Resp: 20 (!) 32 (!) 30 19  Temp:      TempSrc:      SpO2: 97% 98% (S) (!) 89% 97%   Eyes: PERRL, lids and conjunctivae normal ENMT: Mucous membranes are moist. Posterior pharynx clear of any exudate or lesions.Normal dentition.  Neck: normal, supple, no masses, no thyromegaly, shoddy anterior chain lymphadenopathy appreciated,  left supraclavicular lymphadenopathy noted, shoddy axillary lymphadenopathy Respiratory: Bilateral expiratory wheezing with prolonged expiratory phase, no crackles.  Increased respiratory effort. No accessory muscle use.  Cardiovascular: Regular rate and rhythm, no murmurs / rubs / gallops. No extremity edema. 2+ pedal pulses. No carotid bruits.  Abdomen: no tenderness, no masses palpated. No hepatosplenomegaly. Bowel sounds positive.  Musculoskeletal: no clubbing / cyanosis. No joint deformity upper and lower extremities. Good ROM, no contractures. Normal muscle tone.  Skin: no rashes, lesions, ulcers. No induration Neurologic: CN 2-12 grossly intact. Sensation intact, DTR normal. Strength 5/5 in all 4.  Psychiatric: Normal judgment and insight. Alert and oriented x 3. Normal mood.     Labs on Admission: I have personally reviewed following labs and imaging studies  CBC: Recent Labs  Lab 03/25/17 0908  WBC 8.8  HGB 11.1*  HCT 34.3*  MCV 94.8  PLT 914   Basic Metabolic Panel: Recent Labs  Lab 03/25/17 0908  NA 135  K 4.3  CL 102  CO2 22  GLUCOSE 109*  BUN 33*  CREATININE 1.30*  CALCIUM 8.7*   GFR: Estimated Creatinine Clearance: 41.1 mL/min (A) (by C-G formula based on SCr of 1.3 mg/dL (H)). Liver Function Tests: No results for input(s): AST, ALT, ALKPHOS, BILITOT, PROT, ALBUMIN in the last 168 hours. No results for input(s): LIPASE, AMYLASE in the last 168 hours. No results for input(s): AMMONIA in the last 168 hours. Coagulation Profile: No results for input(s): INR, PROTIME in the last 168 hours. Cardiac Enzymes: No results for input(s): CKTOTAL, CKMB, CKMBINDEX, TROPONINI in the last 168 hours. BNP (last 3 results) No results for input(s): PROBNP in the last 8760 hours. HbA1C: No results for input(s): HGBA1C in the last 72 hours. CBG: No results for input(s): GLUCAP in the last 168 hours. Lipid Profile: No results for input(s): CHOL, HDL, LDLCALC, TRIG,  CHOLHDL, LDLDIRECT in the last 72 hours. Thyroid Function Tests: No results for input(s): TSH, T4TOTAL, FREET4, T3FREE, THYROIDAB in the last 72 hours. Anemia Panel: No results for input(s): VITAMINB12, FOLATE, FERRITIN, TIBC, IRON, RETICCTPCT in the last 72 hours. Urine analysis:    Component Value Date/Time   COLORURINE YELLOW 03/25/2017 1553   APPEARANCEUR CLEAR 03/25/2017 1553   LABSPEC >1.046 (H) 03/25/2017 1553   PHURINE 5.0 03/25/2017 1553   GLUCOSEU NEGATIVE 03/25/2017 1553   HGBUR SMALL (A) 03/25/2017 1553   BILIRUBINUR NEGATIVE 03/25/2017 1553   KETONESUR NEGATIVE 03/25/2017 1553   PROTEINUR NEGATIVE 03/25/2017 1553   UROBILINOGEN 1.0 05/22/2008 0957   NITRITE NEGATIVE 03/25/2017 1553   LEUKOCYTESUR NEGATIVE 03/25/2017 1553    Radiological Exams on Admission: Dg Chest 2 View  Result Date: 03/25/2017 CLINICAL DATA:  Cough EXAM: CHEST  2 VIEW COMPARISON:  01/31/2017 FINDINGS: There is hyperinflation of the lungs compatible with COPD. Heart is mildly enlarged. Lingular scarring. Small left pleural effusion versus pleural thickening, stable since prior study. Postoperative changes on the left. Calcifications in the aorta. IMPRESSION: COPD. Postoperative and chronic changes on the left. Blunting of the left costophrenic angle felt to be related to pleural thickening, less likely chronic small effusion. Electronically Signed   By: Rolm Baptise M.D.   On: 03/25/2017 09:41   Ct Chest W Contrast  Result Date: 03/25/2017 CLINICAL DATA:  20 pound weight loss. Productive cough. Low back pain. History of lung cancer and myasthenia gravis. EXAM: CT CHEST, ABDOMEN, AND PELVIS WITH CONTRAST TECHNIQUE: Multidetector CT imaging of the chest, abdomen and pelvis was performed following the standard protocol during bolus administration of intravenous contrast. CONTRAST:  100 mL Isovue-300 COMPARISON:  Chest CT 03/16/2013. CT abdomen and pelvis 02/24/2006. Lumbar spine MRI 11/06/2015. FINDINGS: CT  CHEST FINDINGS Cardiovascular: Extensive thoracic aortic atherosclerosis without aneurysm. Extensive coronary artery atherosclerosis. Normal heart size. Small pericardial effusion, decreased from 2015. Mediastinum/Nodes: New enlarged right hilar lymph node measures 17 mm in short axis. Scattered small mediastinal lymph nodes have slightly increased in number from 2015 but are all 6 mm or less in short axis. There is a small sliding hiatal hernia. The thyroid is grossly unremarkable. Lungs/Pleura: No pleural effusion or pneumothorax. Prior left lower lobectomy. Moderate centrilobular emphysema. Mild bronchial wall thickening diffusely. Multiple new subcentimeter nodules in both lungs. New spiculated nodules measure 12 x 11 mm  in the medial right upper lobe (series 4, image 43), 7 x 7 mm in the medial left upper lobe (series 4, image 45), and 18 x 13 mm in the posterior left upper lobe (series 4, image 63). A new lobular enhancing nodule medially in the superior segment of the right lower lobe measures 18 x 13 mm (series 4, image 78). 17 x 12 mm subpleural ground-glass nodular opacity in the medial left upper lobe is unchanged (series 4, image 50). Musculoskeletal: Chronic T10 compression fracture is unchanged. No suspicious osseous lesion. CT ABDOMEN PELVIS FINDINGS Hepatobiliary: 7 mm low-density lesion in segment II is unchanged from the prior chest CT and too small to fully characterize. A calcification is again noted more inferiorly in the left hepatic lobe. There are small calcified stones in the gallbladder. No biliary dilatation is seen. Pancreas: Unremarkable. Spleen: Unremarkable. Adrenals/Urinary Tract: Unremarkable adrenal glands. Left lower pole renal scarring. Mild enlargement of left renal cysts which measure up to 3.1 cm in size. No hydronephrosis. Unremarkable bladder. Stomach/Bowel: No gross bowel wall thickening or obstruction, with motion artifact mildly limiting assessment. Vascular/Lymphatic:  Extensive atherosclerotic calcification of the abdominal aorta and its major branch vessels. New infrarenal abdominal aortic aneurysm measuring 3.6 cm in maximal transverse diameter and with a configuration which may indicate an old short segment dissection. New 1.1 cm right internal iliac artery aneurysm. No enlarged lymph nodes. Reproductive: Mild prostatic enlargement. Other: No intraperitoneal free fluid. No abdominal wall hernia. Musculoskeletal: Chronic T12 and L4 compression fractures. Permeative appearance of mixed lucency and sclerosis in the right lateral aspect of the L1 vertebral body with new mild right-sided vertebral body height loss and heterogeneous right paravertebral soft tissue mass measuring approximately 5 cm. IMPRESSION: 1. Evidence of thoracic metastatic disease with multiple new bilateral lung nodules and right hilar lymphadenopathy. Dominant nodules in both upper lobes and right upper lobe may reflect one or more primary lung cancers. 2. New L1 compression fracture with appearance concerning for a pathologic fracture from an underlying metastasis. 3. New 3.6 cm infrarenal abdominal aortic aneurysm. Recommend followup by ultrasound in 2 years. This recommendation follows ACR consensus guidelines: White Paper of the ACR Incidental Findings Committee II on Vascular Findings. J Am Coll Radiol 2013; 10:789-794. Aortic Atherosclerosis (ICD10-I70.0). Emphysema (ICD10-J43.9). Aortic aneurysm NOS (ICD10-I71.9). Electronically Signed   By: Logan Bores M.D.   On: 03/25/2017 13:47   Ct Abdomen Pelvis W Contrast  Result Date: 03/25/2017 CLINICAL DATA:  20 pound weight loss. Productive cough. Low back pain. History of lung cancer and myasthenia gravis. EXAM: CT CHEST, ABDOMEN, AND PELVIS WITH CONTRAST TECHNIQUE: Multidetector CT imaging of the chest, abdomen and pelvis was performed following the standard protocol during bolus administration of intravenous contrast. CONTRAST:  100 mL Isovue-300  COMPARISON:  Chest CT 03/16/2013. CT abdomen and pelvis 02/24/2006. Lumbar spine MRI 11/06/2015. FINDINGS: CT CHEST FINDINGS Cardiovascular: Extensive thoracic aortic atherosclerosis without aneurysm. Extensive coronary artery atherosclerosis. Normal heart size. Small pericardial effusion, decreased from 2015. Mediastinum/Nodes: New enlarged right hilar lymph node measures 17 mm in short axis. Scattered small mediastinal lymph nodes have slightly increased in number from 2015 but are all 6 mm or less in short axis. There is a small sliding hiatal hernia. The thyroid is grossly unremarkable. Lungs/Pleura: No pleural effusion or pneumothorax. Prior left lower lobectomy. Moderate centrilobular emphysema. Mild bronchial wall thickening diffusely. Multiple new subcentimeter nodules in both lungs. New spiculated nodules measure 12 x 11 mm in the medial right upper lobe (series  4, image 43), 7 x 7 mm in the medial left upper lobe (series 4, image 45), and 18 x 13 mm in the posterior left upper lobe (series 4, image 63). A new lobular enhancing nodule medially in the superior segment of the right lower lobe measures 18 x 13 mm (series 4, image 78). 17 x 12 mm subpleural ground-glass nodular opacity in the medial left upper lobe is unchanged (series 4, image 50). Musculoskeletal: Chronic T10 compression fracture is unchanged. No suspicious osseous lesion. CT ABDOMEN PELVIS FINDINGS Hepatobiliary: 7 mm low-density lesion in segment II is unchanged from the prior chest CT and too small to fully characterize. A calcification is again noted more inferiorly in the left hepatic lobe. There are small calcified stones in the gallbladder. No biliary dilatation is seen. Pancreas: Unremarkable. Spleen: Unremarkable. Adrenals/Urinary Tract: Unremarkable adrenal glands. Left lower pole renal scarring. Mild enlargement of left renal cysts which measure up to 3.1 cm in size. No hydronephrosis. Unremarkable bladder. Stomach/Bowel: No gross  bowel wall thickening or obstruction, with motion artifact mildly limiting assessment. Vascular/Lymphatic: Extensive atherosclerotic calcification of the abdominal aorta and its major branch vessels. New infrarenal abdominal aortic aneurysm measuring 3.6 cm in maximal transverse diameter and with a configuration which may indicate an old short segment dissection. New 1.1 cm right internal iliac artery aneurysm. No enlarged lymph nodes. Reproductive: Mild prostatic enlargement. Other: No intraperitoneal free fluid. No abdominal wall hernia. Musculoskeletal: Chronic T12 and L4 compression fractures. Permeative appearance of mixed lucency and sclerosis in the right lateral aspect of the L1 vertebral body with new mild right-sided vertebral body height loss and heterogeneous right paravertebral soft tissue mass measuring approximately 5 cm. IMPRESSION: 1. Evidence of thoracic metastatic disease with multiple new bilateral lung nodules and right hilar lymphadenopathy. Dominant nodules in both upper lobes and right upper lobe may reflect one or more primary lung cancers. 2. New L1 compression fracture with appearance concerning for a pathologic fracture from an underlying metastasis. 3. New 3.6 cm infrarenal abdominal aortic aneurysm. Recommend followup by ultrasound in 2 years. This recommendation follows ACR consensus guidelines: White Paper of the ACR Incidental Findings Committee II on Vascular Findings. J Am Coll Radiol 2013; 10:789-794. Aortic Atherosclerosis (ICD10-I70.0). Emphysema (ICD10-J43.9). Aortic aneurysm NOS (ICD10-I71.9). Electronically Signed   By: Logan Bores M.D.   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  Acute exacerbation of COPD with asthma: Patient continues to smoke he smokes at least a pack a day.  He is currently having an acute exacerbation of COPD unfortunately it has been unresponsive to outpatient treatment.  Patient will be admitted into the hospital we will start IV Solu-Medrol, nebulizers, oxygen support, and azithromycin for possible bronchial superinfection.  Will consult pulmonary for further evaluation.  2.  Abnormal weight loss: Likely related to metastatic disease noted in the lungs.  He possibly has another lung primary.  Of course concern is that the patient has never had a colonoscopy.  He does have mild anemia but reports never having had a colonoscopy.  He states his stools have  been dark.  Will Hemoccult stools and consider biopsy of either 1 of his lymph nodes, vertebral fracture area or lungs pending best accessible area.  3.  Recurrent lung cancer of unknown cell type with bilateral lung nodules: Primary at this point is on known these may be metastatic versus primary nodules.  Patient has been cancer free from his lung cancer for more than 10 years.  Await results of biopsy.  I have discussed with interventional radiology who recommended oncology consultation prior to biopsy.  They also recommended outpatient biopsy but I am not sure the patient will be well enough to have this done as an outpatient.  4.  Failure to thrive in adult: Patient has had significant weight loss over the past 3 months.  Likely related to cancer.  There may be a component of COPD however he does not use steroids on a daily basis and he does not wheeze on a daily basis until recently.  5.  Pathological fracture of vertebra neoplastic disease: Patient has an L1 fracture related likely to metastatic disease from a possible lung or other primary.  Patient will require pain  management and possible biopsy of the area.  6.  Degenerative arthritis of the spine: Patient was told by the neurosurgeon that he would avoid operations at all cost he takes symptomatically medications for this.  7.  Myasthenia gravis: Management per neurology currently quiesced sent.  8.  DO NOT RESUSCITATE status noted.   DVT prophylaxis: Lovenox Code Status: DNR Family Communication: Wife was present at the bedside Disposition Plan: Likely home in 3-4 days Consults called: Pulmonology Dr. Pearline Cables, oncology Dr. Jana Hakim  Admission status: Inpatient   Lady Deutscher MD [place] Pager [phone]  If 7PM-7AM, please contact night-coverage [email or web address] Password [facility]  03/25/2017, 5:15 PM

## 2017-03-26 ENCOUNTER — Encounter (HOSPITAL_COMMUNITY): Payer: Self-pay | Admitting: Family Medicine

## 2017-03-26 DIAGNOSIS — E860 Dehydration: Secondary | ICD-10-CM

## 2017-03-26 DIAGNOSIS — I700 Atherosclerosis of aorta: Secondary | ICD-10-CM

## 2017-03-26 DIAGNOSIS — L8990 Pressure ulcer of unspecified site, unspecified stage: Secondary | ICD-10-CM

## 2017-03-26 DIAGNOSIS — J45901 Unspecified asthma with (acute) exacerbation: Secondary | ICD-10-CM

## 2017-03-26 DIAGNOSIS — R627 Adult failure to thrive: Secondary | ICD-10-CM

## 2017-03-26 DIAGNOSIS — D638 Anemia in other chronic diseases classified elsewhere: Secondary | ICD-10-CM

## 2017-03-26 DIAGNOSIS — J441 Chronic obstructive pulmonary disease with (acute) exacerbation: Principal | ICD-10-CM

## 2017-03-26 DIAGNOSIS — M8458XA Pathological fracture in neoplastic disease, other specified site, initial encounter for fracture: Secondary | ICD-10-CM

## 2017-03-26 DIAGNOSIS — C3490 Malignant neoplasm of unspecified part of unspecified bronchus or lung: Secondary | ICD-10-CM

## 2017-03-26 HISTORY — DX: Atherosclerosis of aorta: I70.0

## 2017-03-26 LAB — COMPREHENSIVE METABOLIC PANEL
ALBUMIN: 2.4 g/dL — AB (ref 3.5–5.0)
ALT: 14 U/L — AB (ref 17–63)
AST: 25 U/L (ref 15–41)
Alkaline Phosphatase: 85 U/L (ref 38–126)
Anion gap: 14 (ref 5–15)
BUN: 40 mg/dL — AB (ref 6–20)
CHLORIDE: 103 mmol/L (ref 101–111)
CO2: 21 mmol/L — AB (ref 22–32)
Calcium: 8 mg/dL — ABNORMAL LOW (ref 8.9–10.3)
Creatinine, Ser: 1.21 mg/dL (ref 0.61–1.24)
GFR calc Af Amer: >60 mL/min (ref >60)
GFR calc non Af Amer: 55 mL/min — ABNORMAL LOW (ref >60)
Glucose, Bld: 174 mg/dL — ABNORMAL HIGH (ref 65–99)
POTASSIUM: 3.4 mmol/L — AB (ref 3.5–5.1)
SODIUM: 138 mmol/L (ref 135–145)
Total Bilirubin: 0.4 mg/dL (ref 0.3–1.2)
Total Protein: 5.7 g/dL — ABNORMAL LOW (ref 6.5–8.1)

## 2017-03-26 LAB — CBC
HCT: 30.6 % — ABNORMAL LOW (ref 39.0–52.0)
HEMOGLOBIN: 9.8 g/dL — AB (ref 13.0–17.0)
MCH: 30.2 pg (ref 26.0–34.0)
MCHC: 32 g/dL (ref 30.0–36.0)
MCV: 94.4 fL (ref 78.0–100.0)
Platelets: 199 10*3/uL (ref 150–400)
RBC: 3.24 MIL/uL — AB (ref 4.22–5.81)
RDW: 13.8 % (ref 11.5–15.5)
WBC: 7.8 10*3/uL (ref 4.0–10.5)

## 2017-03-26 MED ORDER — LORAZEPAM 2 MG/ML PO CONC: 2.0000 mg | ORAL | Status: DC | PRN

## 2017-03-26 MED ORDER — POTASSIUM CHLORIDE CRYS ER 20 MEQ PO TBCR
40.0000 meq | EXTENDED_RELEASE_TABLET | Freq: Once | ORAL | Status: AC
  Administered 2017-03-26: 40 meq via ORAL
  Filled 2017-03-26: qty 2

## 2017-03-26 MED ORDER — MORPHINE SULFATE (PF) 4 MG/ML IV SOLN: 2.0000 mg | INTRAVENOUS | Status: DC | PRN

## 2017-03-26 MED ORDER — MORPHINE SULFATE (CONCENTRATE) 10 MG/0.5ML PO SOLN: 20.0000 mg | ORAL | Status: DC | PRN

## 2017-03-26 MED ORDER — MORPHINE SULFATE (CONCENTRATE) 10 MG/0.5ML PO SOLN: 10.0000 mg | ORAL | Status: DC | PRN

## 2017-03-26 MED ORDER — ORAL CARE MOUTH RINSE
15.0000 mL | Freq: Two times a day (BID) | OROMUCOSAL | Status: DC
  Administered 2017-03-26 – 2017-03-27 (×2): 15 mL via OROMUCOSAL

## 2017-03-26 NOTE — Progress Notes (Addendum)
SLP Cancellation Note  Patient Details Name: Chase Nguyen MRN: [id number] DOB: 1935-04-13   Cancelled treatment:       Reason Eval/Treat Not Completed: Other (comment)(Patient with comfort measures only pending DC to hospice.  Given this will discontinue efforts to complete a swallowing and cognitive/linguistic evaluation.  If we can be of further assistance please reconsult.  Thank you.  )  Shelly Flatten, [place], [place] [facility] SLP [phone]  Lamar Sprinkles 03/26/2017, 10:54 AM

## 2017-03-26 NOTE — Progress Notes (Signed)
PROGRESS NOTE  Chase Nguyen IWP:[id number] DOB: 09/05/1935 DOA: 03/25/2017 PCP: Chase Orn, MD  Brief Narrative: 82 year old male Chase Nguyen lung cancer 1999, presented with 20 pound weight loss.  Imaging revealed multiple lung nodules and pathologic fracture of L1.  Patient was seen by pulmonology but elected not to pursue diagnostics or therapeutic treatment.  Palliative care was recommended.  Admitted for acute COPD exacerbation, suspected lung cancer  Assessment/Plan Bilateral pulmonary nodules, pathologic fracture L1.  Suspicious for metastatic colon cancer. -Seen by pulmonology, patient declined to pursue diagnostics including bronchoscopy and biopsy, also declined pursuing chemo radiation therapy. - Seen by palliative care with goal of comfort elected.   - Plan home with hospice when adequately arranged, likely 2/24  Acute COPD exacerbation, emphysema with acute hypoxic respiratory failure. -Continue bronchodilators, steroids, oxygen.  Most likely will need oxygen on discharge.  AKI vs dehydration vs CKD stage III -Fever dehydration, plus minus chronic kidney disease.  Elevated BUN today likely related to steroids.  Anemia of chronic disease  -no further evaluation plan based on goals of care  Failure to thrive with 20 pound weight loss. - no further evaluation  Sacral decubitus ulcer present on admission  3.6 cm infrarenal abdominal aortic aneurysm  Aortic atherosclerosis  Myasthenia gravis diagnosed 2015, well-controlled off medications Sacral decubitus ulcer, present on admission Spinal stenosis with neurogenic claudication, no surgical candidate  PMH Stroke, right leg weakness, hemisensory loss  Smoker   PMH squamous cell carcinoma of the trachea, lung cancer s/p resection (1999), hypertension, history of bladder cancer s/p resection (1992), COPD, carotid stenosis s/p left CEA   DVT prophylaxis: comfort Code Status: DNR Family Communication: wife at  bedside Disposition Plan: home with hospice 2/24    Chase Hodgkins, MD  [facility] Direct contact: [phone] --Via amion app OR  --[email or web address]; password [email or web address]  7PM-7AM contact night coverage as above 03/26/2017, 2:15 PM  LOS: 1 day   Consultants:  Pulmonology   Procedures:    Antimicrobials:    Interval history/Subjective: Feels ok, not short of breath now.  Objective: Vitals:  Vitals:   03/25/17 2231 03/26/17 0635  BP: (!) 100/47 (!) 93/50  Pulse: 89 (!) 127  Resp: 20 20  Temp: 98.1 F (36.7 C) (!) 97.5 F (36.4 C)  SpO2: 97% 97%    Exam:  Constitutional:  . Appears calm and comfortable ENMT:  . Slightly hard of hearing Respiratory:  . Fair air movement, some coarse breath sounds Respiratory effort mildly increased. Able to speak in full sentences Cardiovascular:  . Distant heart sounds . No LE extremity edema   Psychiatric:  . Mental status o Mood, affect appropriate  I have personally reviewed the following:   Labs:  Potassium 3.4, BUN 40, creatinine 1.21 (improved).  LFTs unremarkable.  WBC within normal limits.  Hemoglobin 9.8, likely lower secondary to dilution.  Platelets 199, stable.    TSH within normal limits  Imaging studies:  CXR, CT chest and abdomen noted   Scheduled Meds: . amLODipine  10 mg Oral Daily  . benazepril  20 mg Oral Daily   Or  . hydrochlorothiazide  12.5 mg Oral Daily  . benzonatate  200 mg Oral TID  . docusate sodium  100 mg Oral BID  . feeding supplement (ENSURE ENLIVE)  237 mL Oral BID BM  . ipratropium-albuterol  3 mL Nebulization TID  . levothyroxine  125 mcg Oral QAC breakfast  . mouth rinse  15 mL Mouth Rinse BID  . methylPREDNISolone (SOLU-MEDROL) injection  40 mg Intravenous Q6H  . sodium chloride flush  3 mL Intravenous Q12H   Continuous Infusions: . sodium chloride    . 0.9 % NaCl with KCl 20 mEq / L    . 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

## 2017-03-26 NOTE — Progress Notes (Signed)
Nutrition Brief Note  Patient screened for MST. Chart reviewed. Pt with hx of stroke, tobacco abuse, remote lung cancer in 1999, COPD, myasthenia gravis, and HTN. He was admitted d/t worsening SOB and report of 20 lb weight loss in the past 1 month.   Palliative Care following. Note from today reviewed. Pt now transitioning to comfort care.  No further nutrition interventions warranted at this time.  Please re-consult as needed.      Jarome Matin, MS, RD, LDN, [facility] Inpatient Clinical Dietitian Pager # [phone] After hours/weekend pager # [phone]

## 2017-03-26 NOTE — Care Management (Signed)
Pt set up with [facility] by Palliative Care RN, Threasa Beards.  Will fax discharge information when d/c complete.
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
## 2017-03-27 DIAGNOSIS — R634 Abnormal weight loss: Secondary | ICD-10-CM

## 2017-03-27 MED ORDER — MORPHINE SULFATE 10 MG/5ML PO SOLN: 2.5000 mg | ORAL | 0 refills | Status: DC | PRN

## 2017-03-27 MED ORDER — LORAZEPAM 2 MG/ML PO CONC: 0.5000 mg | Freq: Four times a day (QID) | ORAL | 0 refills | Status: AC | PRN

## 2017-03-27 MED ORDER — LORAZEPAM 2 MG/ML PO CONC: 0.5000 mg | Freq: Four times a day (QID) | ORAL | 0 refills | Status: DC | PRN

## 2017-03-27 MED ORDER — MORPHINE SULFATE 10 MG/5ML PO SOLN: 2.5000 mg | ORAL | 0 refills | Status: AC | PRN

## 2017-03-27 MED ORDER — POLYETHYLENE GLYCOL 3350 17 G PO PACK: 17.0000 g | PACK | Freq: Every day | ORAL | Status: AC | PRN

## 2017-03-27 MED ORDER — PREDNISONE 20 MG PO TABS: 40.0000 mg | ORAL_TABLET | Freq: Every day | ORAL | 0 refills | Status: AC

## 2017-03-27 MED ORDER — ACETAMINOPHEN 325 MG PO TABS: 650.0000 mg | ORAL_TABLET | Freq: Four times a day (QID) | ORAL | Status: AC | PRN

## 2017-03-27 NOTE — Progress Notes (Signed)
COURTESY NOTE: Stopped by to introduce the cancer service to the patient. However the patient and family tell me Chase Nguyen has decided against further evaluation and treatment. They are hoping to go home today with Hospice support. Accordingly I will let our thoracic oncology team know to cancel planned biopsies and visit.  Please let me know if we can be of further help.

## 2017-03-27 NOTE — Progress Notes (Signed)
CSW was consulted by RN Colletta [place] concerning hopsice placement.  CSW contacted Caryl Pina CM for update.  Caryl Pina, CM will arrange for placement.  Caryl Pina will contact [employer].  Reed Breech LCSWA [phone]

## 2017-03-27 NOTE — Progress Notes (Signed)
Patient and family left facility without discharge paperwork. Called family member Vaughan Basta, states she will contact them and have them come back to the hospital to sign discharge papers.

## 2017-03-27 NOTE — Care Management (Signed)
Spoke with RN at [facility] to advise that patient was ready for d/c.  All necessary DME including hospital bed and O2 were delivered to pt's home this am.  Pt will travel home by private vehicle per family's wishes without O2. Pt has been off of O2 today and yesterday and saturations are in the 90%'s.  Pt's wife and children at bedside with additional family support at home.    Orders and d/c summary faxed to [phone].

## 2017-03-27 NOTE — Discharge Summary (Signed)
Physician Discharge Summary  Chase Nguyen WJX:[id number] DOB: 23-Aug-1935 DOA: 03/25/2017  PCP: Chase Orn, MD  Admit date: 03/25/2017 Discharge date: 03/27/2017  Recommendations for Outpatient Follow-up:  1. Home with hospice for suspected metastatic lung cancer 2. Resolution of COPD exacerbation     Discharge Diagnoses:  1. Bilateral pulmonary nodules, pathologic fracture L1 2. Suspected metastatic lung cancer 3. Acute COPD exacerbation, emphysema with acute hypoxic respiratory failure. 4. Dehydration  5. Anemia of chronic disease  6. Failure to thrive with 20 pound weight loss. 7. Sacral decubitus ulcer present on admission 8. 3.6 cm infrarenal abdominal aortic aneurysm 9. Aortic atherosclerosis  Discharge Condition: improved Disposition: home with hospice  Diet recommendation: regular  Filed Weights   03/25/17 1801 03/27/17 0516  Weight: 63.9 kg (140 lb 14 oz) 64.8 kg (142 lb 13.7 oz)    History of present illness:  82 year old male Chase Nguyen lung cancer 1999, presented with 20 pound weight loss.  Imaging revealed multiple lung nodules and pathologic fracture of L1.  Patient was seen by pulmonology but elected not to pursue diagnostics or therapeutic treatment.  Palliative care was recommended.  Admitted for acute COPD exacerbation, suspected lung cancer  Hospital Course:  Patient was treated for COPD exacerbation with rapid clinical improvement will complete course of treatment with prednisone as an outpatient.  Suspected metastatic lung cancer was discussed with patient and family, patient declined further evaluation and treatment and rather requested to go home with hospice.  Given his rapid clinical improvement here, he is now stable for discharge home with hospice.  Individual issues as below.  Bilateral pulmonary nodules, pathologic fracture L1.  Suspicious for metastatic lung cancer. -Seen by pulmonology, patient declined to pursue diagnostics including bronchoscopy  and biopsy, also declined pursuing chemo radiation therapy. - Seen by palliative care with goal of comfort elected.   - Plan home with hospice    Acute COPD exacerbation, emphysema with acute hypoxic respiratory failure. -Continue bronchodilators, steroids, oxygen on discharge  Dehydration  -Resolved with IV fluids  Anemia of chronic disease  -no further evaluation plan based on goals of care  Failure to thrive with 20 pound weight loss. -Secondary to malignancy.  No further evaluation  Sacral decubitus ulcer present on admission  3.6 cm infrarenal abdominal aortic aneurysm  Aortic atherosclerosis   Consultants:  Pulmonology   Today's assessment: S: feels better, breathing better, no pain O: Vitals:  Vitals:   03/27/17 0516 03/27/17 0755  BP: 96/64   Pulse: 78   Resp: 19   Temp: (!) 97.2 F (36.2 C)   SpO2: 98% 99%    Constitutional:  . Appears calm and more comfortable today Respiratory:  . Some coarse breath sounds.  Fair air movement. Marland Kitchen Respiratory effort normal.  Cardiovascular:  . RRR, no m/r/g Psychiatric:  . Mental status o Mood, affect appropriate   Discharge Instructions  Discharge Instructions    Diet - low sodium heart healthy   Complete by:  As directed    Discharge instructions   Complete by:  As directed    Call your physician or hospice for shortness of breath, pain or worsening of condition.   Increase activity slowly   Complete by:  As directed      Allergies as of 03/27/2017   No Known Allergies     Medication List    STOP taking these medications   gabapentin 300 MG capsule Commonly known as:  NEURONTIN   pravastatin 40 MG tablet Commonly known as:  PRAVACHOL   pyridostigmine 60 MG tablet Commonly known as:  MESTINON     TAKE these medications   acetaminophen 325 MG tablet Commonly known as:  TYLENOL Take 2 tablets (650 mg total) by mouth every 6 (six) hours as needed for mild pain (or Fever >/= 101).     amLODipine 10 MG tablet Commonly known as:  NORVASC Take 10 mg by mouth daily.   aspirin 325 MG tablet Take 325 mg by mouth daily.   benazepril-hydrochlorthiazide 20-12.5 MG tablet Commonly known as:  LOTENSIN HCT Take 1 tablet by mouth daily.   diclofenac 75 MG EC tablet Commonly known as:  VOLTAREN   ipratropium-albuterol 0.5-2.5 (3) MG/3ML Soln Commonly known as:  DUONEB   levothyroxine 125 MCG tablet Commonly known as:  SYNTHROID, LEVOTHROID   LORazepam 2 MG/ML concentrated solution Commonly known as:  ATIVAN Take 0.3 mLs (0.6 mg total) by mouth every 6 (six) hours as needed for anxiety (dyspnea).   morphine 10 MG/5ML solution Take 1.3 mLs (2.6 mg total) by mouth every 2 (two) hours as needed for severe pain (dyspnea).   polyethylene glycol packet Commonly known as:  MIRALAX / GLYCOLAX Take 17 g by mouth daily as needed for mild constipation.   predniSONE 20 MG tablet Commonly known as:  DELTASONE Take 2 tablets (40 mg total) by mouth daily with breakfast.   PRESERVISION AREDS PO Take 2 tablets by mouth daily.   vitamin B-12 100 MCG tablet Commonly known as:  CYANOCOBALAMIN Take 100 mcg by mouth daily.      No Known Allergies  The results of significant diagnostics from this hospitalization (including imaging, microbiology, ancillary and laboratory) are listed below for reference.    Significant Diagnostic Studies: Dg Chest 2 View  Result Date: 03/25/2017 CLINICAL DATA:  Cough EXAM: CHEST  2 VIEW COMPARISON:  01/31/2017 FINDINGS: There is hyperinflation of the lungs compatible with COPD. Heart is mildly enlarged. Lingular scarring. Small left pleural effusion versus pleural thickening, stable since prior study. Postoperative changes on the left. Calcifications in the aorta. IMPRESSION: COPD. Postoperative and chronic changes on the left. Blunting of the left costophrenic angle felt to be related to pleural thickening, less likely chronic small effusion.  Electronically Signed   By: Rolm Baptise M.D.   On: 03/25/2017 09:41   Ct Chest W Contrast  Result Date: 03/25/2017 CLINICAL DATA:  20 pound weight loss. Productive cough. Low back pain. History of lung cancer and myasthenia gravis. EXAM: CT CHEST, ABDOMEN, AND PELVIS WITH CONTRAST TECHNIQUE: Multidetector CT imaging of the chest, abdomen and pelvis was performed following the standard protocol during bolus administration of intravenous contrast. CONTRAST:  100 mL Isovue-300 COMPARISON:  Chest CT 03/16/2013. CT abdomen and pelvis 02/24/2006. Lumbar spine MRI 11/06/2015. FINDINGS: CT CHEST FINDINGS Cardiovascular: Extensive thoracic aortic atherosclerosis without aneurysm. Extensive coronary artery atherosclerosis. Normal heart size. Small pericardial effusion, decreased from 2015. Mediastinum/Nodes: New enlarged right hilar lymph node measures 17 mm in short axis. Scattered small mediastinal lymph nodes have slightly increased in number from 2015 but are all 6 mm or less in short axis. There is a small sliding hiatal hernia. The thyroid is grossly unremarkable. Lungs/Pleura: No pleural effusion or pneumothorax. Prior left lower lobectomy. Moderate centrilobular emphysema. Mild bronchial wall thickening diffusely. Multiple new subcentimeter nodules in both lungs. New spiculated nodules measure 12 x 11 mm in the medial right upper lobe (series 4, image 43), 7 x 7 mm in the medial left upper lobe (series 4, image  45), and 18 x 13 mm in the posterior left upper lobe (series 4, image 63). A new lobular enhancing nodule medially in the superior segment of the right lower lobe measures 18 x 13 mm (series 4, image 78). 17 x 12 mm subpleural ground-glass nodular opacity in the medial left upper lobe is unchanged (series 4, image 50). Musculoskeletal: Chronic T10 compression fracture is unchanged. No suspicious osseous lesion. CT ABDOMEN PELVIS FINDINGS Hepatobiliary: 7 mm low-density lesion in segment II is unchanged  from the prior chest CT and too small to fully characterize. A calcification is again noted more inferiorly in the left hepatic lobe. There are small calcified stones in the gallbladder. No biliary dilatation is seen. Pancreas: Unremarkable. Spleen: Unremarkable. Adrenals/Urinary Tract: Unremarkable adrenal glands. Left lower pole renal scarring. Mild enlargement of left renal cysts which measure up to 3.1 cm in size. No hydronephrosis. Unremarkable bladder. Stomach/Bowel: No gross bowel wall thickening or obstruction, with motion artifact mildly limiting assessment. Vascular/Lymphatic: Extensive atherosclerotic calcification of the abdominal aorta and its major branch vessels. New infrarenal abdominal aortic aneurysm measuring 3.6 cm in maximal transverse diameter and with a configuration which may indicate an old short segment dissection. New 1.1 cm right internal iliac artery aneurysm. No enlarged lymph nodes. Reproductive: Mild prostatic enlargement. Other: No intraperitoneal free fluid. No abdominal wall hernia. Musculoskeletal: Chronic T12 and L4 compression fractures. Permeative appearance of mixed lucency and sclerosis in the right lateral aspect of the L1 vertebral body with new mild right-sided vertebral body height loss and heterogeneous right paravertebral soft tissue mass measuring approximately 5 cm. IMPRESSION: 1. Evidence of thoracic metastatic disease with multiple new bilateral lung nodules and right hilar lymphadenopathy. Dominant nodules in both upper lobes and right upper lobe may reflect one or more primary lung cancers. 2. New L1 compression fracture with appearance concerning for a pathologic fracture from an underlying metastasis. 3. New 3.6 cm infrarenal abdominal aortic aneurysm. Recommend followup by ultrasound in 2 years. This recommendation follows ACR consensus guidelines: White Paper of the ACR Incidental Findings Committee II on Vascular Findings. J Am Coll Radiol 2013; 10:789-794.  Aortic Atherosclerosis (ICD10-I70.0). Emphysema (ICD10-J43.9). Aortic aneurysm NOS (ICD10-I71.9). Electronically Signed   By: Logan Bores M.D.   On: 03/25/2017 13:47   Ct Abdomen Pelvis W Contrast  Result Date: 03/25/2017 CLINICAL DATA:  20 pound weight loss. Productive cough. Low back pain. History of lung cancer and myasthenia gravis. EXAM: CT CHEST, ABDOMEN, AND PELVIS WITH CONTRAST TECHNIQUE: Multidetector CT imaging of the chest, abdomen and pelvis was performed following the standard protocol during bolus administration of intravenous contrast. CONTRAST:  100 mL Isovue-300 COMPARISON:  Chest CT 03/16/2013. CT abdomen and pelvis 02/24/2006. Lumbar spine MRI 11/06/2015. FINDINGS: CT CHEST FINDINGS Cardiovascular: Extensive thoracic aortic atherosclerosis without aneurysm. Extensive coronary artery atherosclerosis. Normal heart size. Small pericardial effusion, decreased from 2015. Mediastinum/Nodes: New enlarged right hilar lymph node measures 17 mm in short axis. Scattered small mediastinal lymph nodes have slightly increased in number from 2015 but are all 6 mm or less in short axis. There is a small sliding hiatal hernia. The thyroid is grossly unremarkable. Lungs/Pleura: No pleural effusion or pneumothorax. Prior left lower lobectomy. Moderate centrilobular emphysema. Mild bronchial wall thickening diffusely. Multiple new subcentimeter nodules in both lungs. New spiculated nodules measure 12 x 11 mm in the medial right upper lobe (series 4, image 43), 7 x 7 mm in the medial left upper lobe (series 4, image 45), and 18 x 13 mm in  the posterior left upper lobe (series 4, image 63). A new lobular enhancing nodule medially in the superior segment of the right lower lobe measures 18 x 13 mm (series 4, image 78). 17 x 12 mm subpleural ground-glass nodular opacity in the medial left upper lobe is unchanged (series 4, image 50). Musculoskeletal: Chronic T10 compression fracture is unchanged. No suspicious  osseous lesion. CT ABDOMEN PELVIS FINDINGS Hepatobiliary: 7 mm low-density lesion in segment II is unchanged from the prior chest CT and too small to fully characterize. A calcification is again noted more inferiorly in the left hepatic lobe. There are small calcified stones in the gallbladder. No biliary dilatation is seen. Pancreas: Unremarkable. Spleen: Unremarkable. Adrenals/Urinary Tract: Unremarkable adrenal glands. Left lower pole renal scarring. Mild enlargement of left renal cysts which measure up to 3.1 cm in size. No hydronephrosis. Unremarkable bladder. Stomach/Bowel: No gross bowel wall thickening or obstruction, with motion artifact mildly limiting assessment. Vascular/Lymphatic: Extensive atherosclerotic calcification of the abdominal aorta and its major branch vessels. New infrarenal abdominal aortic aneurysm measuring 3.6 cm in maximal transverse diameter and with a configuration which may indicate an old short segment dissection. New 1.1 cm right internal iliac artery aneurysm. No enlarged lymph nodes. Reproductive: Mild prostatic enlargement. Other: No intraperitoneal free fluid. No abdominal wall hernia. Musculoskeletal: Chronic T12 and L4 compression fractures. Permeative appearance of mixed lucency and sclerosis in the right lateral aspect of the L1 vertebral body with new mild right-sided vertebral body height loss and heterogeneous right paravertebral soft tissue mass measuring approximately 5 cm. IMPRESSION: 1. Evidence of thoracic metastatic disease with multiple new bilateral lung nodules and right hilar lymphadenopathy. Dominant nodules in both upper lobes and right upper lobe may reflect one or more primary lung cancers. 2. New L1 compression fracture with appearance concerning for a pathologic fracture from an underlying metastasis. 3. New 3.6 cm infrarenal abdominal aortic aneurysm. Recommend followup by ultrasound in 2 years. This recommendation follows ACR consensus guidelines: White  Paper of the ACR Incidental Findings Committee II on Vascular Findings. J Am Coll Radiol 2013; 10:789-794. Aortic Atherosclerosis (ICD10-I70.0). Emphysema (ICD10-J43.9). Aortic aneurysm NOS (ICD10-I71.9). Electronically Signed   By: Logan Bores M.D.   On: 03/25/2017 13:47    Labs: Basic Metabolic Panel: Recent Labs  Lab 03/25/17 0908 03/26/17 0314  NA 135 138  K 4.3 3.4*  CL 102 103  CO2 22 21*  GLUCOSE 109* 174*  BUN 33* 40*  CREATININE 1.30* 1.21  CALCIUM 8.7* 8.0*   Liver Function Tests: Recent Labs  Lab 03/26/17 0314  AST 25  ALT 14*  ALKPHOS 85  BILITOT 0.4  PROT 5.7*  ALBUMIN 2.4*   CBC: Recent Labs  Lab 03/25/17 0908 03/26/17 0314  WBC 8.8 7.8  HGB 11.1* 9.8*  HCT 34.3* 30.6*  MCV 94.8 94.4  PLT 199 199    Principal Problem:   Acute exacerbation of COPD with asthma ([place]) Active Problems:   Recurrent lung cancer of unknown cell type (HCC)bilateral lung nodules   FTT (failure to thrive) in adult, decrease over 3 months   DNR (do not resuscitate)   Weight loss, abnormal   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

## 2017-03-27 NOTE — Progress Notes (Signed)
Wife returned to the floor and discharge instructions were given to her and explained. She verbalized understanding of all discharge instructions.

## 2017-03-31 DIAGNOSIS — J441 Chronic obstructive pulmonary disease with (acute) exacerbation: Secondary | ICD-10-CM | POA: Diagnosis not present

## 2017-04-13 DIAGNOSIS — C799 Secondary malignant neoplasm of unspecified site: Secondary | ICD-10-CM | POA: Diagnosis not present

## 2017-04-13 DIAGNOSIS — J449 Chronic obstructive pulmonary disease, unspecified: Secondary | ICD-10-CM | POA: Diagnosis not present

## 2017-05-01 DIAGNOSIS — J441 Chronic obstructive pulmonary disease with (acute) exacerbation: Secondary | ICD-10-CM | POA: Diagnosis not present

## 2017-05-05 DIAGNOSIS — E7800 Pure hypercholesterolemia, unspecified: Secondary | ICD-10-CM | POA: Diagnosis not present

## 2017-05-05 DIAGNOSIS — Z720 Tobacco use: Secondary | ICD-10-CM | POA: Diagnosis not present

## 2017-05-05 DIAGNOSIS — J449 Chronic obstructive pulmonary disease, unspecified: Secondary | ICD-10-CM | POA: Diagnosis not present

## 2017-05-05 DIAGNOSIS — C799 Secondary malignant neoplasm of unspecified site: Secondary | ICD-10-CM | POA: Diagnosis not present

## 2017-05-05 DIAGNOSIS — Z0000 Encounter for general adult medical examination without abnormal findings: Secondary | ICD-10-CM | POA: Diagnosis not present

## 2017-05-05 DIAGNOSIS — Z5181 Encounter for therapeutic drug level monitoring: Secondary | ICD-10-CM | POA: Diagnosis not present

## 2017-05-05 DIAGNOSIS — Z1389 Encounter for screening for other disorder: Secondary | ICD-10-CM | POA: Diagnosis not present

## 2017-05-05 DIAGNOSIS — I10 Essential (primary) hypertension: Secondary | ICD-10-CM | POA: Diagnosis not present

## 2017-08-11 ENCOUNTER — Encounter (HOSPITAL_COMMUNITY): Payer: PPO

## 2017-08-11 ENCOUNTER — Ambulatory Visit: Payer: PPO | Admitting: Family

## 2017-09-01 DIAGNOSIS — 419620001 Death: Secondary | SNOMED CT | POA: Diagnosis not present

## 2017-09-01 DEATH — deceased

## 2017-09-23 ENCOUNTER — Ambulatory Visit: Payer: PPO | Admitting: Neurology

## 2018-05-12 IMAGING — CR DG CHEST 2V
2 series · 2 of 2 positions shown · non-contrast
Comparison: April 03, 2012 chest radiograph and chest CT March 16, 2013

CLINICAL DATA: Shortness of breath.

EXAM:
CHEST  2 VIEW

[w chest pa]
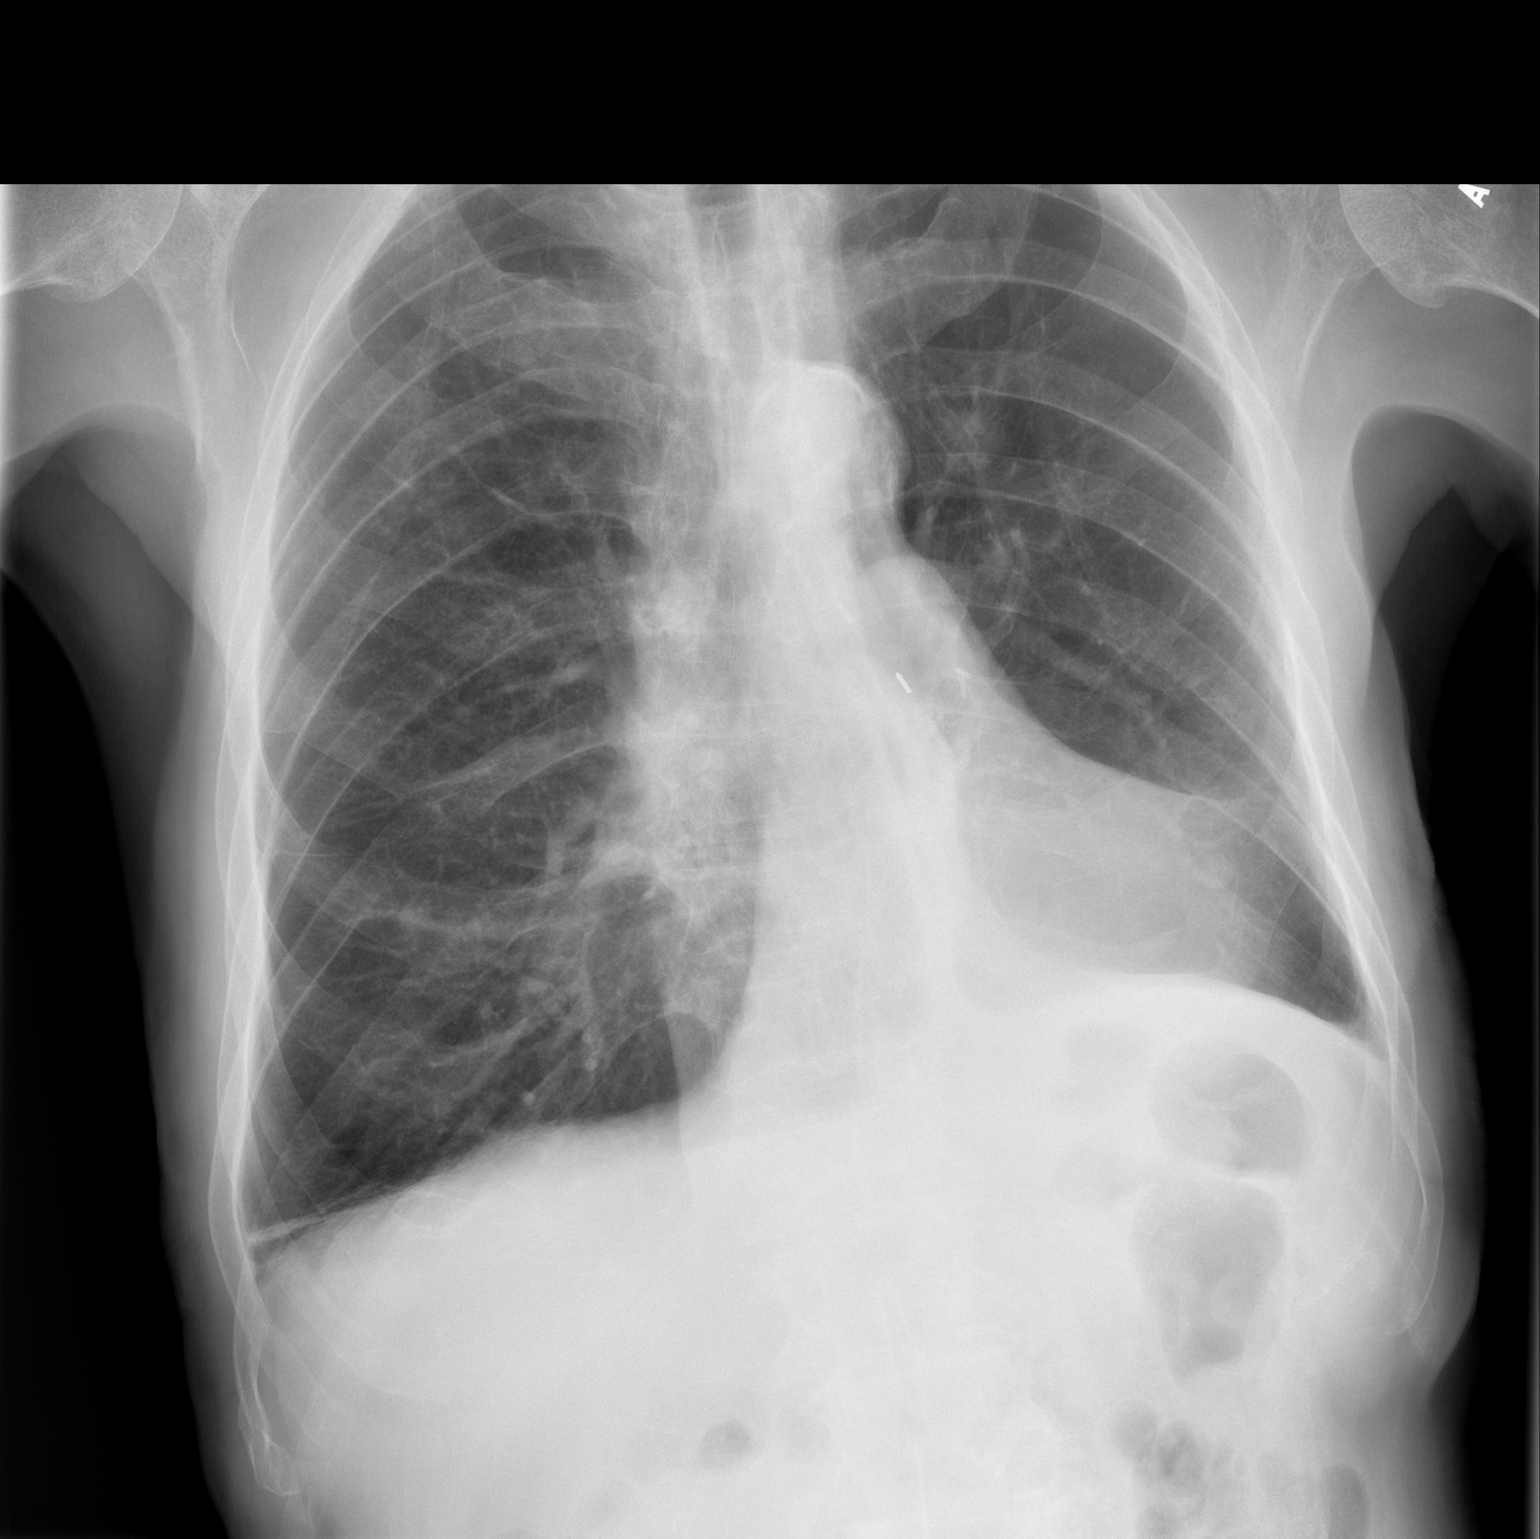

[w chest lat]
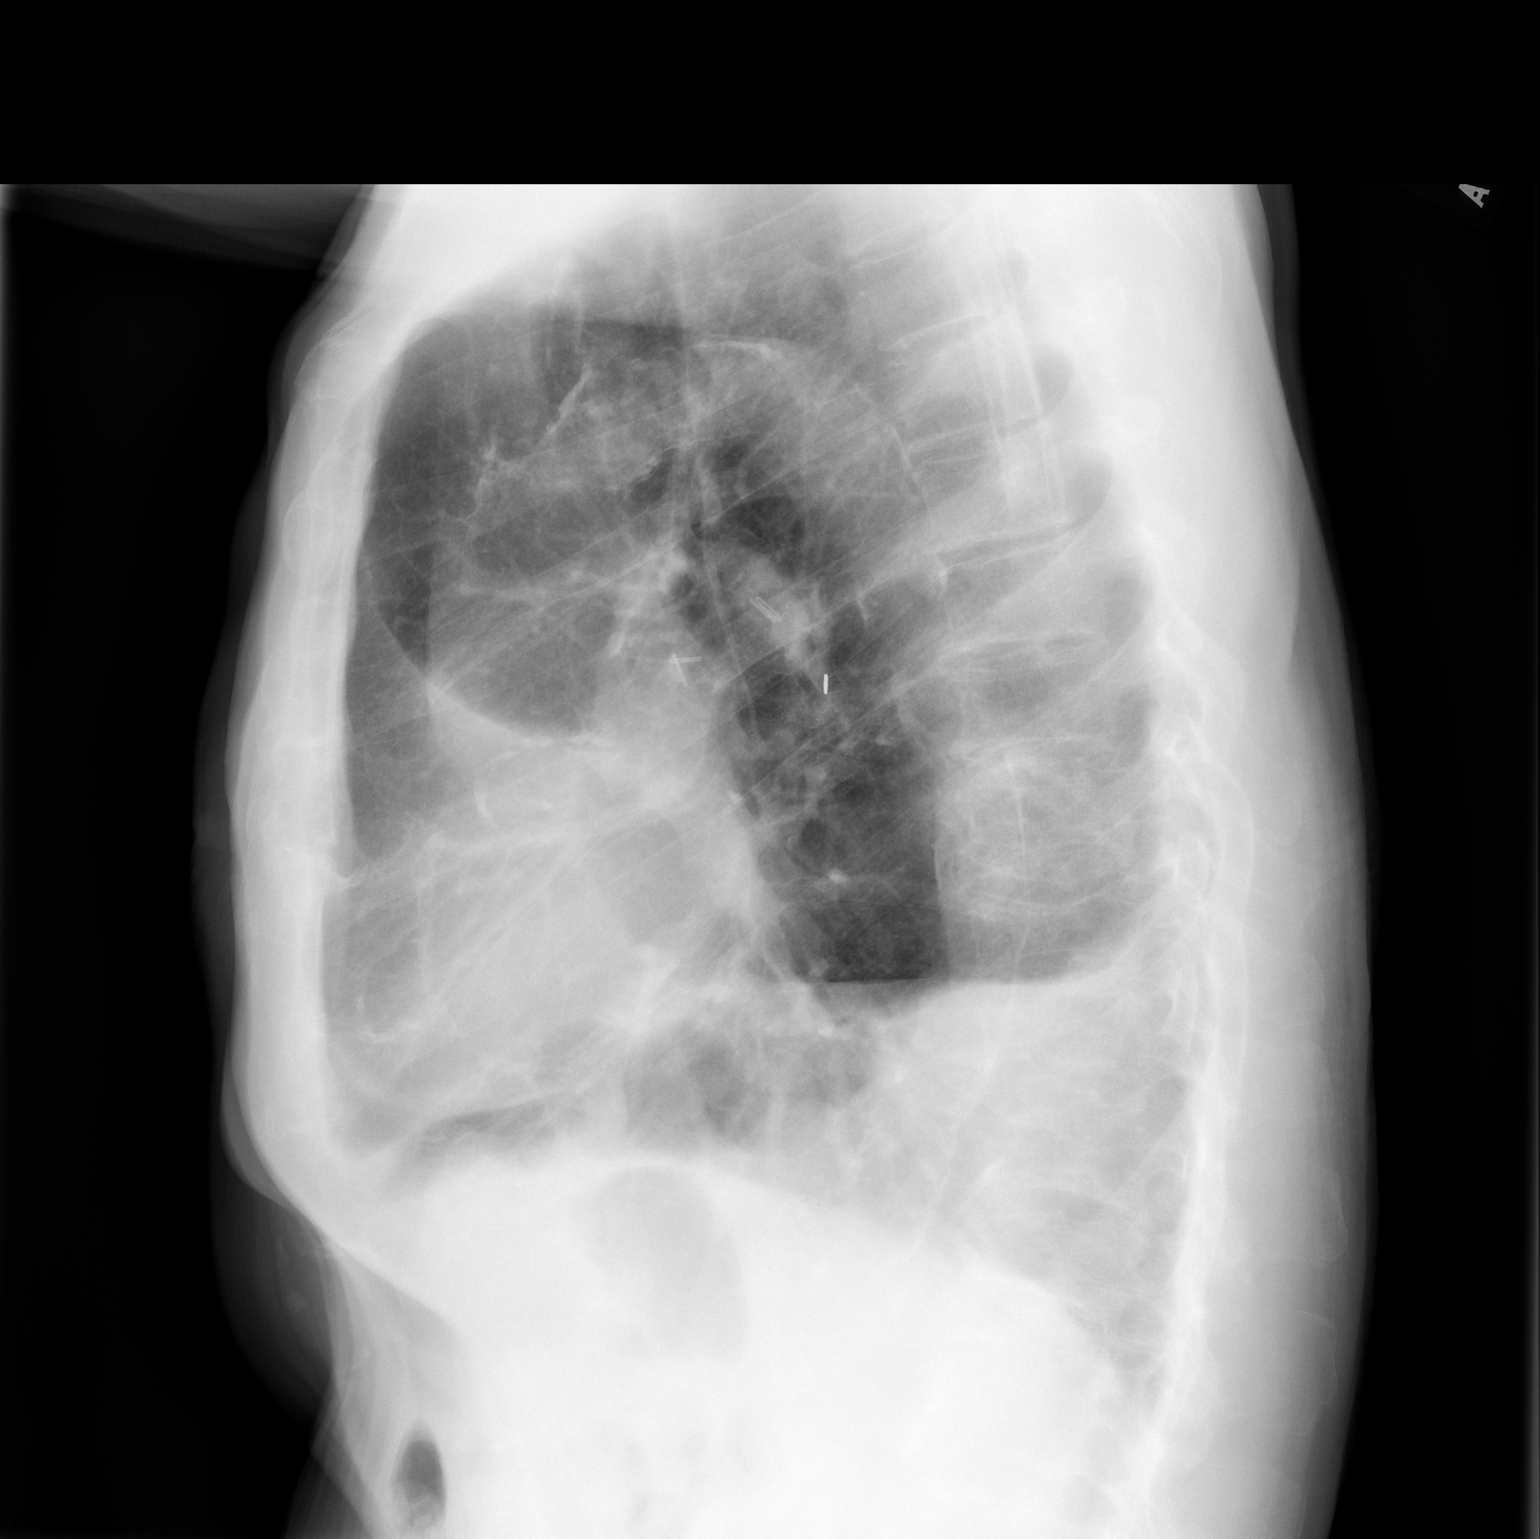

[2 of 2 positions shown; findings below may reference images not displayed]

FINDINGS: There is mild scarring in the lateral right base. Lungs are somewhat
hyperexpanded. There is pleural scarring laterally on the left with
left base atelectasis. Lungs elsewhere clear. Heart is upper normal
in size with pulmonary vascularity within normal limits. There is
aortic atherosclerosis. No adenopathy. Postoperative changes are
noted in the left perihilar region.
IMPRESSION: Pleural scarring left base. Bibasilar atelectasis. No appreciable
edema or consolidation. Postoperative change on left. There is
aortic atherosclerosis. Stable cardiac silhouette.

Aortic Atherosclerosis ([id number]).

## 2018-07-04 IMAGING — CR DG CHEST 2V
2 series · 2 of 2 positions shown · non-contrast
Comparison: 01/31/2017

CLINICAL DATA: Cough

EXAM:
CHEST  2 VIEW

[chest pa]
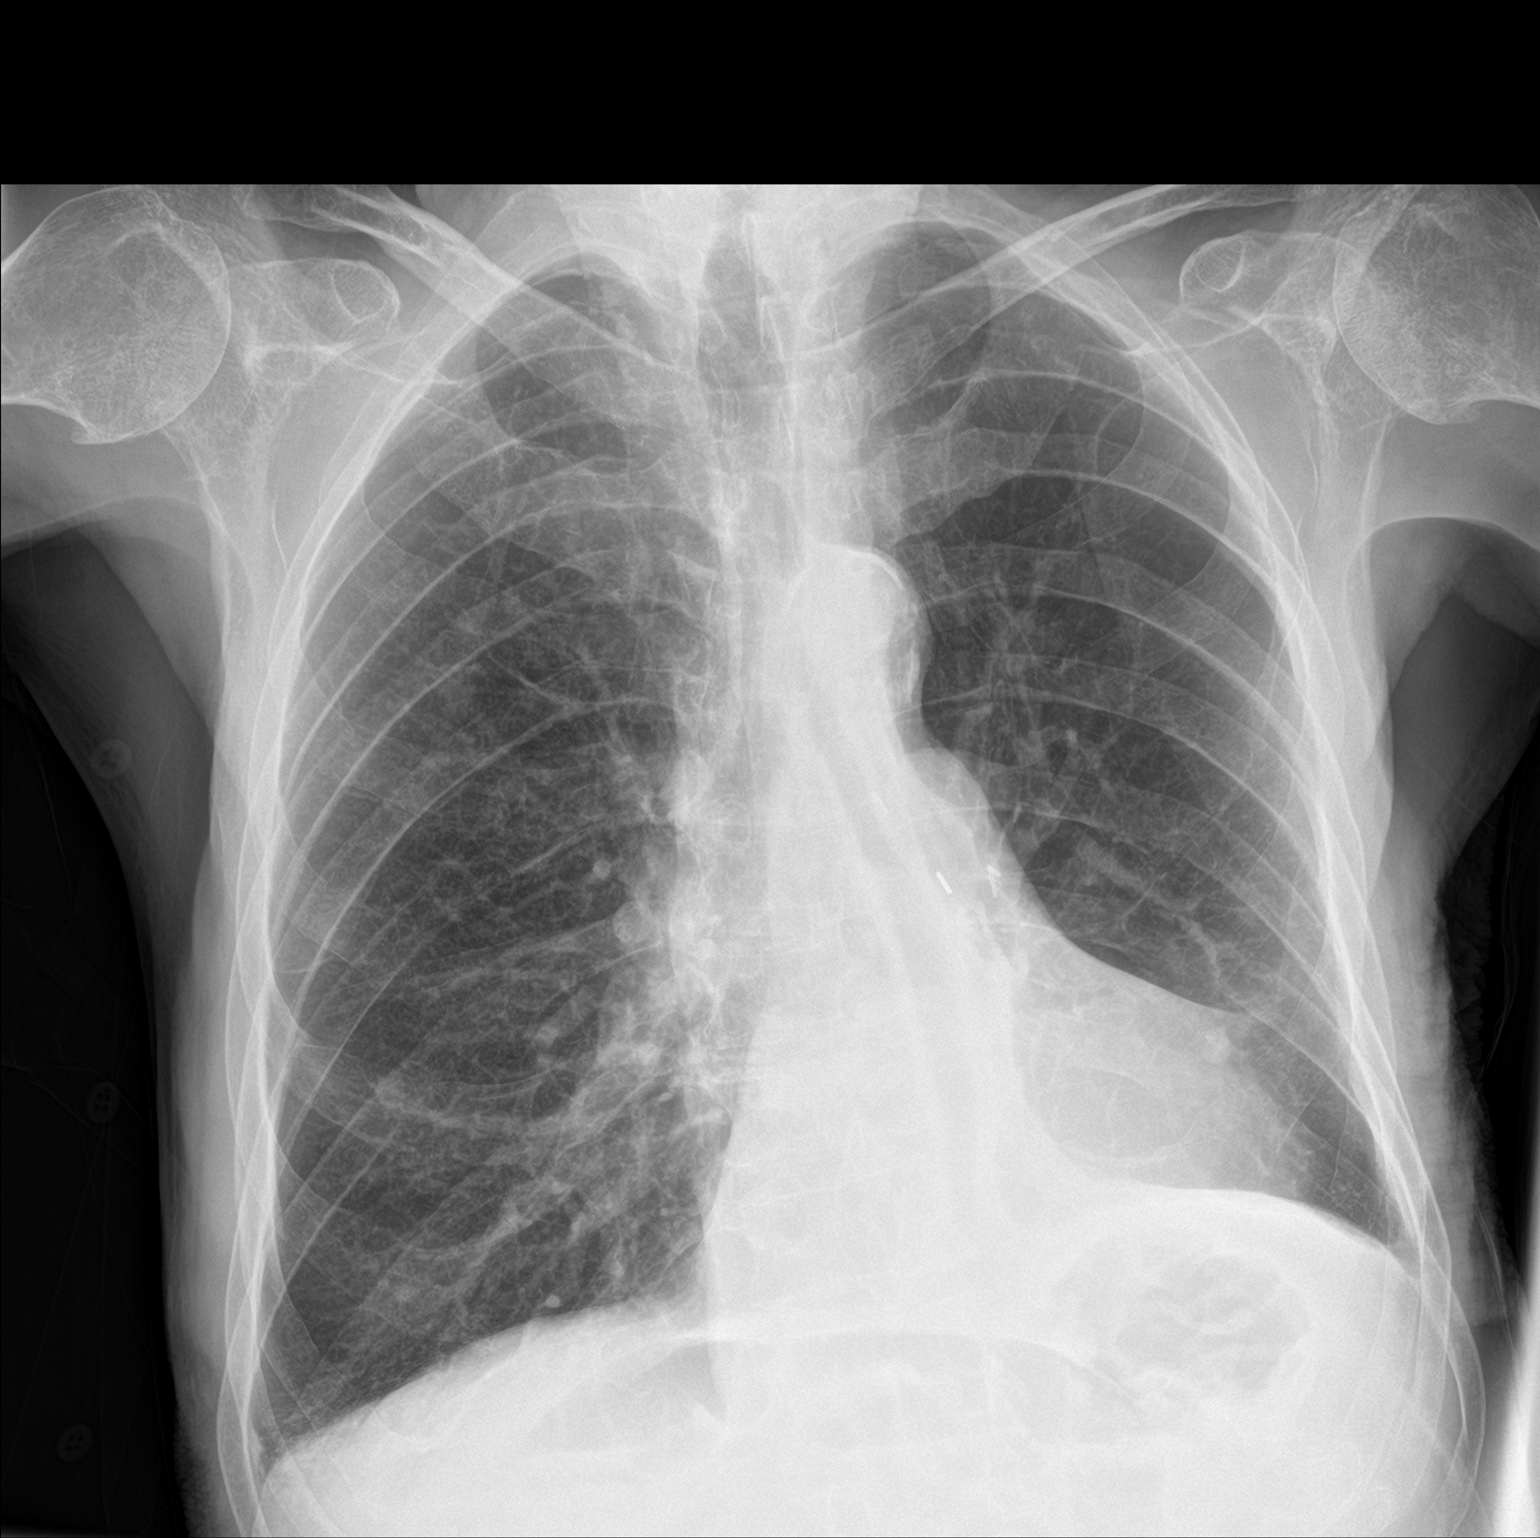

[chest lat]
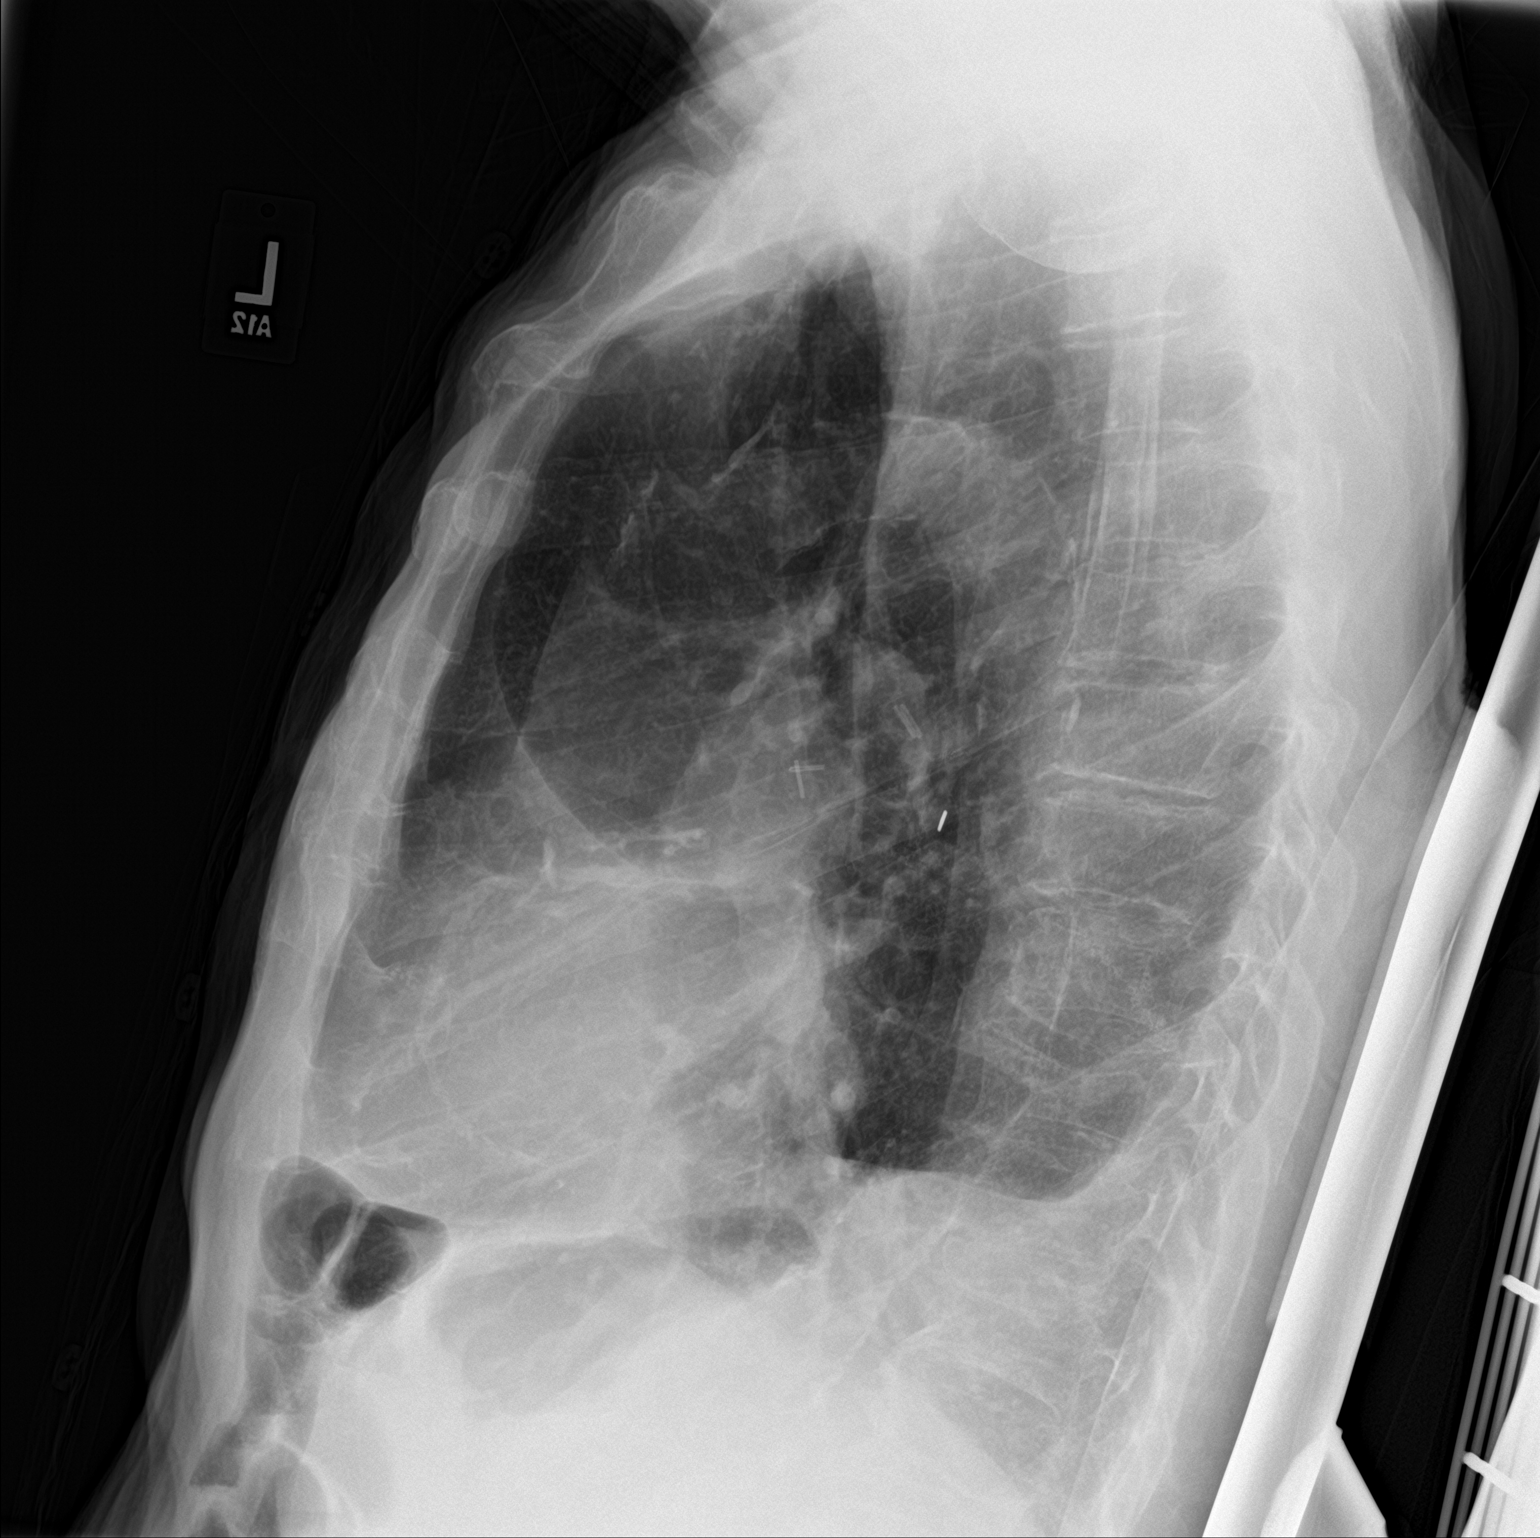

[2 of 2 positions shown; findings below may reference images not displayed]

FINDINGS: There is hyperinflation of the lungs compatible with COPD. Heart is
mildly enlarged. Lingular scarring. Small left pleural effusion
versus pleural thickening, stable since prior study. Postoperative
changes on the left. Calcifications in the aorta.
IMPRESSION: COPD. Postoperative and chronic changes on the left. Blunting of the
left costophrenic angle felt to be related to pleural thickening,
less likely chronic small effusion.
